# Patient Record
Sex: Male | Born: 1985
Health system: Southern US, Community
[De-identification: ages and names within clinical notes are randomized; demographics above are authoritative.]

## PROBLEM LIST (undated history)

## (undated) DIAGNOSIS — K922 Gastrointestinal hemorrhage, unspecified: Secondary | ICD-10-CM

## (undated) DIAGNOSIS — F319 Bipolar disorder, unspecified: Secondary | ICD-10-CM

## (undated) DIAGNOSIS — Z9889 Other specified postprocedural states: Secondary | ICD-10-CM

## (undated) DIAGNOSIS — IMO0002 Reserved for concepts with insufficient information to code with codable children: Secondary | ICD-10-CM

---

## 2012-11-24 ENCOUNTER — Encounter (HOSPITAL_COMMUNITY): Payer: Self-pay | Admitting: Emergency Medicine

## 2012-11-24 ENCOUNTER — Emergency Department (HOSPITAL_COMMUNITY): Payer: Managed Care, Other (non HMO)

## 2012-11-24 ENCOUNTER — Emergency Department (HOSPITAL_COMMUNITY)
Admission: EM | Admit: 2012-11-24 | Discharge: 2012-11-24 | Disposition: A | Discharge status: Home / Self Care | Payer: Managed Care, Other (non HMO) | Attending: Emergency Medicine | Admitting: Emergency Medicine

## 2012-11-24 DIAGNOSIS — R1110 Vomiting, unspecified: Secondary | ICD-10-CM

## 2012-11-24 DIAGNOSIS — R1084 Generalized abdominal pain: Secondary | ICD-10-CM | POA: Insufficient documentation

## 2012-11-24 DIAGNOSIS — R1032 Left lower quadrant pain: Secondary | ICD-10-CM | POA: Insufficient documentation

## 2012-11-24 DIAGNOSIS — F17200 Nicotine dependence, unspecified, uncomplicated: Secondary | ICD-10-CM | POA: Insufficient documentation

## 2012-11-24 DIAGNOSIS — R112 Nausea with vomiting, unspecified: Secondary | ICD-10-CM | POA: Insufficient documentation

## 2012-11-24 LAB — COMPREHENSIVE METABOLIC PANEL
BUN: 10 mg/dL (ref 6–23)
Calcium: 10.3 mg/dL (ref 8.4–10.5)
GFR calc Af Amer: >90 mL/min (ref >90)
Glucose, Bld: 156 mg/dL — ABNORMAL HIGH (ref 70–99)
Total Protein: 7.2 g/dL (ref 6.0–8.3)

## 2012-11-24 LAB — URINALYSIS, ROUTINE W REFLEX MICROSCOPIC
Bilirubin Urine: NEGATIVE
Hgb urine dipstick: NEGATIVE
Protein, ur: NEGATIVE mg/dL
Specific Gravity, Urine: 1.023 (ref 1.005–1.030)
Urobilinogen, UA: 0.2 mg/dL (ref 0.0–1.0)

## 2012-11-24 LAB — CBC WITH DIFFERENTIAL/PLATELET
Eosinophils Absolute: 0 10*3/uL (ref 0.0–0.7)
Eosinophils Relative: 0 % (ref 0–5)
Hemoglobin: 14.7 g/dL (ref 13.0–17.0)
Lymphs Abs: 1.1 10*3/uL (ref 0.7–4.0)
MCH: 30.8 pg (ref 26.0–34.0)
MCV: 88.7 fL (ref 78.0–100.0)
Monocytes Relative: 7 % (ref 3–12)
Platelets: 230 10*3/uL (ref 150–400)
RBC: 4.78 MIL/uL (ref 4.22–5.81)

## 2012-11-24 LAB — LIPASE, BLOOD: Lipase: 30 U/L (ref 11–59)

## 2012-11-24 LAB — TYPE AND SCREEN: Antibody Screen: NEGATIVE

## 2012-11-24 LAB — RAPID URINE DRUG SCREEN, HOSP PERFORMED
Barbiturates: NOT DETECTED
Cocaine: NOT DETECTED
Tetrahydrocannabinol: POSITIVE — AB

## 2012-11-24 MED ORDER — IOHEXOL 300 MG/ML  SOLN
25.0000 mL | INTRAMUSCULAR | Status: AC
  Administered 2012-11-24: 25 mL via ORAL

## 2012-11-24 MED ORDER — MORPHINE SULFATE 4 MG/ML IJ SOLN
4.0000 mg | Freq: Once | INTRAMUSCULAR | Status: AC
  Administered 2012-11-24: 4 mg via INTRAVENOUS

## 2012-11-24 MED ORDER — IOHEXOL 300 MG/ML  SOLN
80.0000 mL | Freq: Once | INTRAMUSCULAR | Status: AC | PRN
  Administered 2012-11-24: 80 mL via INTRAVENOUS

## 2012-11-24 MED ORDER — PROMETHAZINE HCL 25 MG PO TABS: 25.0000 mg | ORAL_TABLET | Freq: Four times a day (QID) | ORAL | Status: DC | PRN

## 2012-11-24 MED ORDER — PROMETHAZINE HCL 25 MG/ML IJ SOLN
25.0000 mg | Freq: Once | INTRAMUSCULAR | Status: AC
  Administered 2012-11-24: 25 mg via INTRAVENOUS
  Filled 2012-11-24: qty 1

## 2012-11-24 MED ORDER — ONDANSETRON HCL 4 MG/2ML IJ SOLN
4.0000 mg | Freq: Once | INTRAMUSCULAR | Status: AC
  Administered 2012-11-24: 4 mg via INTRAVENOUS

## 2012-11-24 MED ORDER — SODIUM CHLORIDE 0.9 % IV BOLUS (SEPSIS)
1000.0000 mL | Freq: Once | INTRAVENOUS | Status: AC
  Administered 2012-11-24: 1000 mL via INTRAVENOUS

## 2012-11-24 MED ORDER — PROMETHAZINE HCL 25 MG/ML IJ SOLN
25.0000 mg | INTRAMUSCULAR | Status: DC | PRN
Start: 2012-11-24 — End: 2012-11-24
  Administered 2012-11-24: 25 mg via INTRAVENOUS
  Filled 2012-11-24: qty 1

## 2012-11-24 NOTE — ED Provider Notes (Signed)
TIME SEEN: 3:01 AM  CHIEF COMPLAINT: Vomiting, abdominal pain  HPI: Patient is a 27 year old male with a history of prior binge drinking and prior upper GI bleed who presents the emergency department with diffuse abdominal pain worse in the left lower quadrant and vomiting for the past 2 hours. He reports his symptoms are similar to a with drinking heavily. He reports he had 2 drinks tonight. He is unsure if he has had pancreatitis before. He states that he did have an endoscopy approximately 1-2 years ago but is unsure of the results. Denies any fever. No chest pain or shortness of breath. No diarrhea. No melena. No bloody stool.  ROS: See HPI Constitutional: no fever  Eyes: no drainage  ENT: no runny nose   Cardiovascular:  no chest pain  Resp: no SOB  GI:  vomiting GU: no dysuria Integumentary: no rash  Allergy: no hives  Musculoskeletal: no leg swelling  Neurological: no slurred speech ROS otherwise negative  PAST MEDICAL HISTORY/PAST SURGICAL HISTORY:  History reviewed. No pertinent past medical history.  MEDICATIONS:  Prior to Admission medications   Not on File    ALLERGIES:  No Known Allergies  SOCIAL HISTORY:  History  Substance Use Topics  . Smoking status: Current Every Day Smoker -- 1.00 packs/day    Types: Cigarettes  . Smokeless tobacco: Not on file  . Alcohol Use: Yes    FAMILY HISTORY: History reviewed. No pertinent family history.  EXAM: BP 129/72  Temp(Src) 98.1 F (36.7 C) (Oral)  Resp 20  SpO2 100% CONSTITUTIONAL: Pale, appears uncomfortable, vomiting actively HEAD: Normocephalic EYES: Conjunctivae clear, PERRL, no pale conjuctivae ENT: normal nose; no rhinorrhea; moist mucous membranes; pharynx without lesions noted NECK: Supple, no meningismus, no LAD  CARD: RRR; S1 and S2 appreciated; no murmurs, no clicks, no rubs, no gallops RESP: Normal chest excursion without splinting or tachypnea; breath sounds clear and equal bilaterally; no  wheezes, no rhonchi, no rales,  ABD/GI: Normal bowel sounds; non-distended; soft, diffusely tender to palpation but worse in the left lower quadrant without guarding or rebound BACK:  The back appears normal and is non-tender to palpation, there is no CVA tenderness EXT: Normal ROM in all joints; non-tender to palpation; no edema; normal capillary refill; no cyanosis    SKIN: Normal color for age and race; warm NEURO: Moves all extremities equally PSYCH: The patient's mood and manner are appropriate. Grooming and personal hygiene are appropriate.  MEDICAL DECISION MAKING: Patient with prior upper GI bleed after heavy drinking who presents emergency department with diffuse abdominal pain and vomiting after 2 alcoholic drinks tonight. Patient denies a history of recent heavy alcohol use. He is not sure if he has had esophageal varices or pancreatitis in the past. He states his last endoscopy was approximately 2 years ago at [facility]. On exam, patient is vomiting but hemodynamically stable. His abdomen is diffusely tender to palpation he has no peritoneal signs. Will obtain basic labs, CT abdomen pelvis, and give pain meds and anti-emetics. Will obtain outside hospital records.  ED PROGRESS: Labs show leukocytosis with left shift. His LFTs and lipase are unremarkable. Outside hospital records from [facility] revealed a CT scan on 08/13/2011 which was normal. Have no other records for this patient at their facility. He reports now he thinks his endoscopy was at [facility]. Will attempt to obtain these medical records. Symptoms have improved after IV Phenergan. CT of his abdomen and pelvis is pending. Ethanol was 0.  5:00 AM  Patient reports he is vomiting blood. On my examination, patient is clearing his throat so violently that he spits up a very small amount of blood. There is not been any active vomiting in the last hour. He reports he is unable to keep his contrast down. Will discuss  with radiology to perform CT without contrast.  5:30 AM  Radiology does not feel they will get an adequate scan without oral contrast. Have discussed with patient that if he is unable to keep contrast down we will need to place NG tube.  Patient and family agree.  6:00 AM On review of records from [facility], patient was admitted to the hospital on 08/14/11 for coffee-ground emesis in the setting of heavy alcohol use. He did not have an endoscopy at that time. He was discharged home with a diagnosis of alcoholic gastritis versus ulcer.  6:32 AM  Pt reports feeling much better and has been able to tolerate by mouth liquids. No further vomiting. His CT scan is unremarkable. He is ready for discharge home. Patient feels that he must eat something bad. We'll discharge with return precautions, prescription for Phenergan, PCP followup. Patient and family verbalized understanding and are comfortable with the plan.     Layla Maw Vernell Back, DO 11/24/12 [id number]

## 2012-11-24 NOTE — ED Notes (Signed)
Per EMS- pt has been throwing up for 2 hours.

## 2012-11-24 NOTE — ED Notes (Signed)
Pt. States nausea is getting better but is unable to drink contrast. States "it upsets my stomach". Talked with Dr. Elesa Massed and CT staff who stated patient need to drink contrast. Talked with patient again about drinking contrast, patient cooperative.

## 2012-11-24 NOTE — ED Notes (Signed)
Pt states he has been throwing up for two hours. Pt states he did not eat today and had only two drinks tonight.

## 2012-11-26 ENCOUNTER — Emergency Department (HOSPITAL_COMMUNITY)
Admission: EM | Admit: 2012-11-26 | Discharge: 2012-11-26 | Disposition: A | Discharge status: Home / Self Care | Payer: Managed Care, Other (non HMO) | Attending: Emergency Medicine | Admitting: Emergency Medicine

## 2012-11-26 ENCOUNTER — Encounter (HOSPITAL_COMMUNITY): Payer: Self-pay

## 2012-11-26 DIAGNOSIS — R109 Unspecified abdominal pain: Secondary | ICD-10-CM

## 2012-11-26 DIAGNOSIS — R1084 Generalized abdominal pain: Secondary | ICD-10-CM | POA: Insufficient documentation

## 2012-11-26 DIAGNOSIS — E872 Acidosis, unspecified: Secondary | ICD-10-CM | POA: Insufficient documentation

## 2012-11-26 DIAGNOSIS — F17200 Nicotine dependence, unspecified, uncomplicated: Secondary | ICD-10-CM | POA: Insufficient documentation

## 2012-11-26 DIAGNOSIS — R197 Diarrhea, unspecified: Secondary | ICD-10-CM | POA: Insufficient documentation

## 2012-11-26 DIAGNOSIS — R52 Pain, unspecified: Secondary | ICD-10-CM | POA: Insufficient documentation

## 2012-11-26 DIAGNOSIS — F319 Bipolar disorder, unspecified: Secondary | ICD-10-CM | POA: Insufficient documentation

## 2012-11-26 DIAGNOSIS — Z872 Personal history of diseases of the skin and subcutaneous tissue: Secondary | ICD-10-CM | POA: Insufficient documentation

## 2012-11-26 DIAGNOSIS — E860 Dehydration: Secondary | ICD-10-CM | POA: Insufficient documentation

## 2012-11-26 DIAGNOSIS — Z8719 Personal history of other diseases of the digestive system: Secondary | ICD-10-CM | POA: Insufficient documentation

## 2012-11-26 DIAGNOSIS — Z79899 Other long term (current) drug therapy: Secondary | ICD-10-CM | POA: Insufficient documentation

## 2012-11-26 DIAGNOSIS — Z9889 Other specified postprocedural states: Secondary | ICD-10-CM | POA: Insufficient documentation

## 2012-11-26 DIAGNOSIS — R112 Nausea with vomiting, unspecified: Secondary | ICD-10-CM | POA: Insufficient documentation

## 2012-11-26 HISTORY — DX: Other specified postprocedural states: Z98.890

## 2012-11-26 HISTORY — DX: Gastrointestinal hemorrhage, unspecified: K92.2

## 2012-11-26 HISTORY — DX: Bipolar disorder, unspecified: F31.9

## 2012-11-26 HISTORY — DX: Reserved for concepts with insufficient information to code with codable children: IMO0002

## 2012-11-26 LAB — COMPREHENSIVE METABOLIC PANEL
Albumin: 4.8 g/dL (ref 3.5–5.2)
Alkaline Phosphatase: 62 U/L (ref 39–117)
BUN: 7 mg/dL (ref 6–23)
Calcium: 10 mg/dL (ref 8.4–10.5)
Potassium: 3 mEq/L — ABNORMAL LOW (ref 3.5–5.1)
Total Protein: 7.6 g/dL (ref 6.0–8.3)

## 2012-11-26 LAB — URINALYSIS, ROUTINE W REFLEX MICROSCOPIC
Bilirubin Urine: NEGATIVE
Glucose, UA: NEGATIVE mg/dL
Hgb urine dipstick: NEGATIVE
Specific Gravity, Urine: 1.022 (ref 1.005–1.030)
pH: 8.5 — ABNORMAL HIGH (ref 5.0–8.0)

## 2012-11-26 LAB — CBC WITH DIFFERENTIAL/PLATELET
HCT: 43.4 % (ref 39.0–52.0)
Hemoglobin: 15.5 g/dL (ref 13.0–17.0)
Lymphocytes Relative: 11 % — ABNORMAL LOW (ref 12–46)
Lymphs Abs: 1.2 10*3/uL (ref 0.7–4.0)
MCHC: 35.7 g/dL (ref 30.0–36.0)
Monocytes Absolute: 1.1 10*3/uL — ABNORMAL HIGH (ref 0.1–1.0)
Monocytes Relative: 10 % (ref 3–12)
Neutro Abs: 8.7 10*3/uL — ABNORMAL HIGH (ref 1.7–7.7)
RBC: 4.89 MIL/uL (ref 4.22–5.81)
WBC: 11.1 10*3/uL — ABNORMAL HIGH (ref 4.0–10.5)

## 2012-11-26 LAB — CG4 I-STAT (LACTIC ACID): Lactic Acid, Venous: 4.23 mmol/L — ABNORMAL HIGH (ref 0.5–2.2)

## 2012-11-26 LAB — LIPASE, BLOOD: Lipase: 23 U/L (ref 11–59)

## 2012-11-26 LAB — URINE MICROSCOPIC-ADD ON

## 2012-11-26 MED ORDER — ONDANSETRON HCL 4 MG/2ML IJ SOLN
4.0000 mg | Freq: Once | INTRAMUSCULAR | Status: AC
  Administered 2012-11-26: 4 mg via INTRAVENOUS
  Filled 2012-11-26: qty 2

## 2012-11-26 MED ORDER — SODIUM CHLORIDE 0.9 % IV BOLUS (SEPSIS)
2000.0000 mL | Freq: Once | INTRAVENOUS | Status: AC
  Administered 2012-11-26: 1000 mL via INTRAVENOUS

## 2012-11-26 MED ORDER — POTASSIUM CHLORIDE 10 MEQ/100ML IV SOLN
10.0000 meq | INTRAVENOUS | Status: AC
  Administered 2012-11-26 (×4): 10 meq via INTRAVENOUS
  Filled 2012-11-26: qty 100

## 2012-11-26 MED ORDER — FAMOTIDINE IN NACL 20-0.9 MG/50ML-% IV SOLN
20.0000 mg | Freq: Once | INTRAVENOUS | Status: AC
  Administered 2012-11-26: 20 mg via INTRAVENOUS
  Filled 2012-11-26: qty 50

## 2012-11-26 MED ORDER — HYDROMORPHONE HCL PF 1 MG/ML IJ SOLN
1.0000 mg | Freq: Once | INTRAMUSCULAR | Status: AC
  Administered 2012-11-26: 1 mg via INTRAVENOUS
  Filled 2012-11-26: qty 1

## 2012-11-26 MED ORDER — ONDANSETRON 4 MG PO TBDP: 4.0000 mg | ORAL_TABLET | Freq: Three times a day (TID) | ORAL | Status: DC | PRN

## 2012-11-26 NOTE — ED Provider Notes (Signed)
CSN: [id number]     Arrival date & time 11/26/12  0145 History   First MD Initiated Contact with Patient 11/26/12 0150     Chief Complaint  Patient presents with  . Abdominal Pain   (Consider location/radiation/quality/duration/timing/severity/associated sxs/prior Treatment) HPI This patient is a 27 year old man who presents with complaints of diffuse abdominal pain, nausea and vomiting along with diarrhea for the past 3 days. The patient reports intractable vomiting. He is unable to tolerate any by mouth intake. He says he has had innumerable episodes of nonbloody, nonbilious emesis per day. He has had about 5-6 episodes per day of brown, watery and nonbloody diarrhea stools.  The patient has not appreciated a fever. He describes his pain as diffuse, cramping and 10 over 10 in severity.  The patient was seen in this emergency department yesterday and had a very extensive workup which included a contrasted CT scan of the abdomen and pelvis which was unremarkable. The patient was noted to have a white blood cell count of 14,000 and a glucose of 156. He was also noted to have significant ketones in his urine. His kidney and liver function were normal. Lipase level was normal.  The patient was discharged with a prescription for promethazine. He says he has been unable to tolerate this medication.  The patient says he drinks alcohol 3-4 times per week and usually drinks 2-3 drinks at a time. He does not believe that he drinks to excess. He does not use NSAIDs on a regular basis. He says that he thinks he may have been told that he has had a peptic ulcer in the past. However, he has not had symptoms such as these before.  Past Medical History  Diagnosis Date  . Bipolar 1 disorder   . GI bleed   . H/O endoscopy   . Ulcer    History reviewed. No pertinent past surgical history. History reviewed. No pertinent family history. History  Substance Use Topics  . Smoking status: Current Every Day  Smoker -- 1.00 packs/day    Types: Cigarettes  . Smokeless tobacco: Not on file  . Alcohol Use: Yes    Review of Systems Gen: General malaise. No fever. No chills. No night sweats. Eyes: no discharge or drainage, no occular pain or visual changes Nose: no epistaxis or rhinorrhea Mouth: no dental pain, no sore throat Neck: no neck pain Lungs: no SOB, cough, wheezing CV: no chest pain, palpitations, dependent edema or orthopnea Abd: As per history of present illness, otherwise negative GU: Patient notes that his urine has appeared more concentrated than usual. MSK: no myalgias or arthralgias Neuro: no headache, no focal neurologic deficits Skin: no rash Psyche: Bipolar disorder which the patient reports is well controlled.  Allergies  Review of patient's allergies indicates no known allergies.  Home Medications   Current Outpatient Rx  Name  Route  Sig  Dispense  Refill  . HydrOXYzine Pamoate (VISTARIL PO)   Oral   Take 1 tablet by mouth 4 (four) times daily as needed.         . lamoTRIgine (LAMICTAL) 100 MG tablet   Oral   Take 100 mg by mouth daily.         . ondansetron (ZOFRAN) 4 MG/2ML SOLN injection   Intravenous   Inject 4 mg into the vein once. X 2 doses         . promethazine (PHENERGAN) 25 MG tablet   Oral   Take 1 tablet (25 mg total)  by mouth every 6 (six) hours as needed for nausea.   15 tablet   0    BP 136/70  Temp(Src) 98.2 F (36.8 C) (Oral)  Resp 18  SpO2 100% Physical Exam Gen: well developed and well nourished appearing, the patient appears uncomfortable, he is, saying, laying supine in gurney with a bath towel held against his chest. Head: NCAT Eyes: PERL, EOMI Nose: no epistaixis or rhinorrhea Mouth/throat: mucosa is moist and pink Neck: supple, no stridor Lungs: CTA B, no wheezing, rhonchi or rales CV: Regular rate and rhythm, no murmur, rate approximately 60 beats per minute Abd: The abdomen is soft and nondistended but  diffusely tender. Back: no ttp, no cva ttp Skin: no rashese, wnl Neuro: CN ii-xii grossly intact, no focal deficits Psyche; normal affect,  calm and cooperative.   ED Course  Procedures (including critical care time)  Results for orders placed during the hospital encounter of 11/26/12 (from the past 24 hour(s))  CG4 I-STAT (LACTIC ACID)     Status: Abnormal   Collection Time    11/26/12  2:08 AM      Result Value Range   Lactic Acid, Venous 4.23 (*) 0.5 - 2.2 mmol/L  COMPREHENSIVE METABOLIC PANEL     Status: Abnormal   Collection Time    11/26/12  2:12 AM      Result Value Range   Sodium 139  135 - 145 mEq/L   Potassium 3.0 (*) 3.5 - 5.1 mEq/L   Chloride 98  96 - 112 mEq/L   CO2 20  19 - 32 mEq/L   Glucose, Bld 164 (*) 70 - 99 mg/dL   BUN 7  6 - 23 mg/dL   Creatinine, Ser 5.62  0.50 - 1.35 mg/dL   Calcium 13.0  8.4 - 86.5 mg/dL   Total Protein 7.6  6.0 - 8.3 g/dL   Albumin 4.8  3.5 - 5.2 g/dL   AST 20  0 - 37 U/L   ALT 17  0 - 53 U/L   Alkaline Phosphatase 62  39 - 117 U/L   Total Bilirubin 0.8  0.3 - 1.2 mg/dL   GFR calc non Af Amer >90  >90 mL/min   GFR calc Af Amer >90  >90 mL/min  LIPASE, BLOOD     Status: None   Collection Time    11/26/12  2:12 AM      Result Value Range   Lipase 23  11 - 59 U/L  CBC WITH DIFFERENTIAL     Status: Abnormal   Collection Time    11/26/12  2:12 AM      Result Value Range   WBC 11.1 (*) 4.0 - 10.5 K/uL   RBC 4.89  4.22 - 5.81 MIL/uL   Hemoglobin 15.5  13.0 - 17.0 g/dL   HCT 78.4  69.6 - 29.5 %   MCV 88.8  78.0 - 100.0 fL   MCH 31.7  26.0 - 34.0 pg   MCHC 35.7  30.0 - 36.0 g/dL   RDW 28.4  13.2 - 44.0 %   Platelets 262  150 - 400 K/uL   Neutrophils Relative % 78 (*) 43 - 77 %   Neutro Abs 8.7 (*) 1.7 - 7.7 K/uL   Lymphocytes Relative 11 (*) 12 - 46 %   Lymphs Abs 1.2  0.7 - 4.0 K/uL   Monocytes Relative 10  3 - 12 %   Monocytes Absolute 1.1 (*) 0.1 - 1.0 K/uL   Eosinophils Relative  0  0 - 5 %   Eosinophils Absolute 0.0  0.0  - 0.7 K/uL   Basophils Relative 0  0 - 1 %   Basophils Absolute 0.0  0.0 - 0.1 K/uL  LACTIC ACID, PLASMA     Status: None   Collection Time    11/26/12  3:20 AM      Result Value Range   Lactic Acid, Venous 1.6  0.5 - 2.2 mmol/L    Labs Review .Imaging Review Ct Abdomen Pelvis W Contrast  11/24/2012   *RADIOLOGY REPORT*  Clinical Data: Left lower quadrant pain.  CT ABDOMEN AND PELVIS WITH CONTRAST  Technique:  Multidetector CT imaging of the abdomen and pelvis was performed following the standard protocol during bolus administration of intravenous contrast.  Contrast: 80mL OMNIPAQUE IOHEXOL 300 MG/ML  SOLN  Comparison: None.  Findings:  LOWER CHEST:  Mediastinum: Small sliding-type hiatal hernia.  Lungs/pleura: No consolidation.  ABDOMEN/PELVIS:  Liver: No focal abnormality.  Biliary: No evidence of biliary obstruction or stone.  Pancreas: Unremarkable.  Spleen: Unremarkable.  Adrenals: Unremarkable.  Kidneys and ureters: No hydronephrosis or stone.  Bladder: Unremarkable.  Bowel: No obstruction. Normal appendix.  Retroperitoneum: No mass or adenopathy.  Peritoneum: No free fluid or gas.  Reproductive: Unremarkable.  Vascular: No acute abnormality.  OSSEOUS: No acute abnormalities. Calcifications associated with the root sleeve in the right anterior S1 sacral foramen.  IMPRESSION: No acute intra-abdominal abnormality.   Original Report Authenticated By: Tiburcio Pea    MDM  Patient feeling much better after tx with 2L of crystalloid IV, Zofran, analgesia and antiemetic. Looks completely better. Now laughing and conversing. Abdominal pain has resolved. Initial lactic acid done with POC device was elevated but, recheck with traditional assay is wnl. We will po challenge and re-evaluate for discharge. The patient has a history of PUD and is currently taking omeprazole. I have suggested that he follow up with his gastroenterologist.   We will prescribe Zofran as an alternative to promethazine which  has not been effective in controlling the patient's sx. He is counseled to return to the ED for worsening sx or red flag sx.     Brandt Loosen, MD 11/27/12 2255

## 2012-11-26 NOTE — ED Notes (Signed)
Pt handed an urinal to use 

## 2012-11-26 NOTE — ED Notes (Signed)
Pt c/o generalized abd pain, nausea, and vomiting blood x2 days. Pt seen here last night for the same and discharged home at 0130 am

## 2012-11-26 NOTE — ED Notes (Signed)
Pt asked could he obtain a urine sample. Pt stated that he couldn't obtain one at this moment. Pt advised to call staff once he feels he can get a urine sample. Will follow up shortly

## 2012-12-23 ENCOUNTER — Inpatient Hospital Stay (HOSPITAL_COMMUNITY)
Admission: EM | Admit: 2012-12-23 | Discharge: 2012-12-26 | DRG: 918 | Disposition: A | Discharge status: Home / Self Care | Payer: Managed Care, Other (non HMO) | Attending: Internal Medicine | Admitting: Internal Medicine

## 2012-12-23 ENCOUNTER — Encounter (HOSPITAL_COMMUNITY): Payer: Self-pay | Admitting: *Deleted

## 2012-12-23 DIAGNOSIS — F319 Bipolar disorder, unspecified: Secondary | ICD-10-CM | POA: Diagnosis present

## 2012-12-23 DIAGNOSIS — F1210 Cannabis abuse, uncomplicated: Secondary | ICD-10-CM | POA: Diagnosis present

## 2012-12-23 DIAGNOSIS — F12129 Cannabis abuse with intoxication, unspecified: Secondary | ICD-10-CM

## 2012-12-23 DIAGNOSIS — R1115 Cyclical vomiting syndrome unrelated to migraine: Secondary | ICD-10-CM | POA: Diagnosis present

## 2012-12-23 DIAGNOSIS — K920 Hematemesis: Secondary | ICD-10-CM | POA: Diagnosis present

## 2012-12-23 DIAGNOSIS — T407X1A Poisoning by cannabis (derivatives), accidental (unintentional), initial encounter: Secondary | ICD-10-CM

## 2012-12-23 DIAGNOSIS — F1221 Cannabis dependence, in remission: Secondary | ICD-10-CM | POA: Diagnosis present

## 2012-12-23 DIAGNOSIS — R109 Unspecified abdominal pain: Secondary | ICD-10-CM

## 2012-12-23 DIAGNOSIS — T40904A Poisoning by unspecified psychodysleptics [hallucinogens], undetermined, initial encounter: Principal | ICD-10-CM | POA: Diagnosis present

## 2012-12-23 DIAGNOSIS — R112 Nausea with vomiting, unspecified: Secondary | ICD-10-CM

## 2012-12-23 DIAGNOSIS — E872 Acidosis, unspecified: Secondary | ICD-10-CM | POA: Diagnosis present

## 2012-12-23 DIAGNOSIS — F17200 Nicotine dependence, unspecified, uncomplicated: Secondary | ICD-10-CM | POA: Diagnosis present

## 2012-12-23 DIAGNOSIS — E876 Hypokalemia: Secondary | ICD-10-CM | POA: Diagnosis present

## 2012-12-23 DIAGNOSIS — T40901A Poisoning by unspecified psychodysleptics [hallucinogens], accidental (unintentional), initial encounter: Secondary | ICD-10-CM | POA: Diagnosis present

## 2012-12-23 LAB — CBC WITH DIFFERENTIAL/PLATELET
Basophils Absolute: 0.1 10*3/uL (ref 0.0–0.1)
Basophils Relative: 0 % (ref 0–1)
Eosinophils Absolute: 0.1 10*3/uL (ref 0.0–0.7)
Eosinophils Relative: 0 % (ref 0–5)
MCH: 32 pg (ref 26.0–34.0)
MCHC: 36.2 g/dL — ABNORMAL HIGH (ref 30.0–36.0)
MCV: 88.4 fL (ref 78.0–100.0)
Neutrophils Relative %: 84 % — ABNORMAL HIGH (ref 43–77)
Platelets: 229 10*3/uL (ref 150–400)
RBC: 4.93 MIL/uL (ref 4.22–5.81)
RDW: 13 % (ref 11.5–15.5)

## 2012-12-23 MED ORDER — SODIUM CHLORIDE 0.9 % IV BOLUS (SEPSIS)
1000.0000 mL | Freq: Once | INTRAVENOUS | Status: AC
  Administered 2012-12-24: 1000 mL via INTRAVENOUS

## 2012-12-23 MED ORDER — SODIUM CHLORIDE 0.9 % IV SOLN
80.0000 mg | Freq: Once | INTRAVENOUS | Status: AC
  Administered 2012-12-24: 01:00:00 80 mg via INTRAVENOUS
  Filled 2012-12-23: qty 80

## 2012-12-23 MED ORDER — MORPHINE SULFATE 4 MG/ML IJ SOLN
4.0000 mg | Freq: Once | INTRAMUSCULAR | Status: AC
  Administered 2012-12-24: 4 mg via INTRAVENOUS
  Filled 2012-12-23: qty 1

## 2012-12-23 MED ORDER — PROMETHAZINE HCL 25 MG/ML IJ SOLN
25.0000 mg | Freq: Once | INTRAMUSCULAR | Status: AC
  Administered 2012-12-24: 25 mg via INTRAVENOUS
  Filled 2012-12-23: qty 1

## 2012-12-23 MED ORDER — ONDANSETRON 4 MG PO TBDP
8.0000 mg | ORAL_TABLET | Freq: Once | ORAL | Status: AC
  Administered 2012-12-23: 8 mg via ORAL
  Filled 2012-12-23: qty 2

## 2012-12-23 NOTE — ED Notes (Signed)
The pt is c/o abd pain  For one hour with nv.  History of the same

## 2012-12-23 NOTE — ED Notes (Signed)
The pt had 1-2 beers  earlier

## 2012-12-24 ENCOUNTER — Emergency Department (HOSPITAL_COMMUNITY): Payer: Managed Care, Other (non HMO)

## 2012-12-24 DIAGNOSIS — R112 Nausea with vomiting, unspecified: Secondary | ICD-10-CM

## 2012-12-24 DIAGNOSIS — T43591A Poisoning by other antipsychotics and neuroleptics, accidental (unintentional), initial encounter: Secondary | ICD-10-CM

## 2012-12-24 DIAGNOSIS — R1115 Cyclical vomiting syndrome unrelated to migraine: Secondary | ICD-10-CM | POA: Diagnosis present

## 2012-12-24 DIAGNOSIS — F19929 Other psychoactive substance use, unspecified with intoxication, unspecified: Secondary | ICD-10-CM

## 2012-12-24 DIAGNOSIS — T40904A Poisoning by unspecified psychodysleptics [hallucinogens], undetermined, initial encounter: Principal | ICD-10-CM

## 2012-12-24 DIAGNOSIS — T407X1A Poisoning by cannabis (derivatives), accidental (unintentional), initial encounter: Secondary | ICD-10-CM

## 2012-12-24 DIAGNOSIS — K920 Hematemesis: Secondary | ICD-10-CM

## 2012-12-24 DIAGNOSIS — F1221 Cannabis dependence, in remission: Secondary | ICD-10-CM | POA: Diagnosis present

## 2012-12-24 LAB — URINE MICROSCOPIC-ADD ON

## 2012-12-24 LAB — CBC
Hemoglobin: 13.3 g/dL (ref 13.0–17.0)
MCH: 31.1 pg (ref 26.0–34.0)
MCHC: 35 g/dL (ref 30.0–36.0)
MCV: 88.8 fL (ref 78.0–100.0)
Platelets: 181 10*3/uL (ref 150–400)
RDW: 13.3 % (ref 11.5–15.5)

## 2012-12-24 LAB — URINALYSIS, ROUTINE W REFLEX MICROSCOPIC
Bilirubin Urine: NEGATIVE
Ketones, ur: >80 mg/dL — AB
Nitrite: NEGATIVE
Protein, ur: NEGATIVE mg/dL
Specific Gravity, Urine: 1.026 (ref 1.005–1.030)
Urobilinogen, UA: 0.2 mg/dL (ref 0.0–1.0)

## 2012-12-24 LAB — GASTRIC OCCULT BLOOD (1-CARD TO LAB): Occult Blood, Gastric: POSITIVE — AB

## 2012-12-24 LAB — RAPID URINE DRUG SCREEN, HOSP PERFORMED
Barbiturates: NOT DETECTED
Benzodiazepines: POSITIVE — AB
Cocaine: NOT DETECTED
Opiates: POSITIVE — AB
Tetrahydrocannabinol: POSITIVE — AB

## 2012-12-24 LAB — COMPREHENSIVE METABOLIC PANEL
ALT: 16 U/L (ref 0–53)
Albumin: 4.7 g/dL (ref 3.5–5.2)
Alkaline Phosphatase: 69 U/L (ref 39–117)
Calcium: 9.8 mg/dL (ref 8.4–10.5)
GFR calc Af Amer: >90 mL/min (ref >90)
Potassium: 3.8 mEq/L (ref 3.5–5.1)
Sodium: 139 mEq/L (ref 135–145)
Total Protein: 7.6 g/dL (ref 6.0–8.3)

## 2012-12-24 LAB — CG4 I-STAT (LACTIC ACID): Lactic Acid, Venous: 5.38 mmol/L — ABNORMAL HIGH (ref 0.5–2.2)

## 2012-12-24 LAB — LIPASE, BLOOD: Lipase: 23 U/L (ref 11–59)

## 2012-12-24 LAB — TYPE AND SCREEN

## 2012-12-24 LAB — LACTIC ACID, PLASMA: Lactic Acid, Venous: 1.7 mmol/L (ref 0.5–2.2)

## 2012-12-24 MED ORDER — ONDANSETRON HCL 4 MG/2ML IJ SOLN
4.0000 mg | Freq: Once | INTRAMUSCULAR | Status: AC
  Administered 2012-12-24: 4 mg via INTRAVENOUS
  Filled 2012-12-24: qty 2

## 2012-12-24 MED ORDER — INFLUENZA VAC SPLIT QUAD 0.5 ML IM SUSP
0.5000 mL | INTRAMUSCULAR | Status: AC
  Administered 2012-12-24: 0.5 mL via INTRAMUSCULAR
  Filled 2012-12-24: qty 0.5

## 2012-12-24 MED ORDER — LAMOTRIGINE 100 MG PO TABS
100.0000 mg | ORAL_TABLET | Freq: Every day | ORAL | Status: DC
  Administered 2012-12-24 – 2012-12-26 (×2): 100 mg via ORAL
  Filled 2012-12-24 (×3): qty 1

## 2012-12-24 MED ORDER — INFLUENZA VAC SPLIT QUAD 0.5 ML IM SUSP: 0.5000 mL | INTRAMUSCULAR | Status: DC

## 2012-12-24 MED ORDER — ONDANSETRON 4 MG PO TBDP
4.0000 mg | ORAL_TABLET | Freq: Three times a day (TID) | ORAL | Status: DC | PRN
  Administered 2012-12-26: 4 mg via ORAL
  Filled 2012-12-24 (×3): qty 1

## 2012-12-24 MED ORDER — SODIUM CHLORIDE 0.9 % IV BOLUS (SEPSIS)
1000.0000 mL | Freq: Once | INTRAVENOUS | Status: AC
  Administered 2012-12-24: 1000 mL via INTRAVENOUS

## 2012-12-24 MED ORDER — ONDANSETRON HCL 4 MG/2ML IJ SOLN
4.0000 mg | Freq: Three times a day (TID) | INTRAMUSCULAR | Status: AC | PRN
  Administered 2012-12-24: 4 mg via INTRAVENOUS
  Filled 2012-12-24: qty 2

## 2012-12-24 MED ORDER — CHLORHEXIDINE GLUCONATE 0.12 % MT SOLN
15.0000 mL | Freq: Two times a day (BID) | OROMUCOSAL | Status: DC
  Administered 2012-12-24: 15 mL via OROMUCOSAL
  Filled 2012-12-24: qty 15

## 2012-12-24 MED ORDER — PROMETHAZINE HCL 25 MG PO TABS
25.0000 mg | ORAL_TABLET | Freq: Four times a day (QID) | ORAL | Status: DC | PRN
  Administered 2012-12-24 – 2012-12-25 (×2): 25 mg via ORAL
  Filled 2012-12-24 (×2): qty 1

## 2012-12-24 MED ORDER — SODIUM CHLORIDE 0.9 % IV SOLN
INTRAVENOUS | Status: AC
  Administered 2012-12-24 (×2): via INTRAVENOUS

## 2012-12-24 MED ORDER — OXYCODONE HCL 5 MG PO TABS
5.0000 mg | ORAL_TABLET | Freq: Four times a day (QID) | ORAL | Status: DC | PRN
  Administered 2012-12-25: 5 mg via ORAL
  Filled 2012-12-24: qty 1

## 2012-12-24 NOTE — H&P (Addendum)
[facility] History and Physical  Preston Brewer ZOX:[id number] DOB: 13-Mar-1986 DOA: 12/23/2012  Referring physician: ED PCP: No PCP Per Patient  Chief Complaint: N/V  HPI: Preston Brewer is a 27 y.o. male who presents with acute onset, severe, nausea and vomiting.  Just as symptoms occurred last month during his ED stay, his symptoms were initially severe, associated with a lactic acidosis, but spontaneously resolved after a couple of hours while in the ED.  His vomit is blood streaked due to the severity of his vomiting.  The patient before I can ask about what makes symptoms better or worse, the patient volunteers that hot showers are the only thing that seems to make his symptoms better.  After he states this, I have his father leave the room for privacy concerns, and then ask the patient how much Cannabis he has been smoking.  He freely admits to smoking cannabis and when asked how much states "probably enough to kill a person".  When asked to clarify he states this is about 8 blunts a day.  Review of Systems: 12 systems reviewed and otherwise negative.  Past Medical History  Diagnosis Date  . Bipolar 1 disorder   . GI bleed   . H/O endoscopy   . Ulcer    History reviewed. No pertinent past surgical history. Social History:  reports that he has been smoking Cigarettes.  He has been smoking about 1.00 pack per day. He does not have any smokeless tobacco history on file. He reports that  drinks alcohol. He reports that he uses illicit drugs (Marijuana).   No Known Allergies  No family history on file.  Prior to Admission medications   Medication Sig Start Date End Date Taking? Authorizing Provider  calcium carbonate (TUMS EX) 750 MG chewable tablet Chew 4 tablets by mouth as needed for heartburn.   Yes Historical Provider, MD  HydrOXYzine Pamoate (VISTARIL PO) Take 1 tablet by mouth daily.    Yes Historical Provider, MD  lamoTRIgine (LAMICTAL) 100 MG tablet Take 100 mg by mouth  daily.   Yes Historical Provider, MD  ondansetron (ZOFRAN ODT) 4 MG disintegrating tablet Take 1 tablet (4 mg total) by mouth every 8 (eight) hours as needed for nausea. 11/26/12  Yes Jennifer L Piepenbrink, PA-C  promethazine (PHENERGAN) 25 MG tablet Take 1 tablet (25 mg total) by mouth every 6 (six) hours as needed for nausea. 11/24/12  Yes Kristen N Ward, DO   Physical Exam: Filed Vitals:   12/24/12 0431  BP: 127/70  Pulse: 63  Temp: 98.3 F (36.8 C)  Resp: 18    General:  NAD, resting comfortably in bed Eyes: PEERLA EOMI ENT: mucous membranes moist Neck: supple w/o JVD Cardiovascular: RRR w/o MRG Respiratory: CTA B Abdomen: soft, nt, nd, bs+ Skin: no rash nor lesion Musculoskeletal: MAE, full ROM all 4 extremities Psychiatric: normal tone and affect Neurologic: AAOx3, grossly non-focal  Labs on Admission:  Basic Metabolic Panel:  Recent Labs Lab 12/23/12 2310  NA 139  K 3.8  CL 98  CO2 20  GLUCOSE 195*  BUN 11  CREATININE 0.95  CALCIUM 9.8   Liver Function Tests:  Recent Labs Lab 12/23/12 2310  AST 21  ALT 16  ALKPHOS 69  BILITOT 0.7  PROT 7.6  ALBUMIN 4.7    Recent Labs Lab 12/23/12 2310  LIPASE 23   No results found for this basename: AMMONIA,  in the last 168 hours CBC:  Recent Labs Lab 12/23/12 2310  WBC  18.7*  NEUTROABS 15.8*  HGB 15.8  HCT 43.6  MCV 88.4  PLT 229   Cardiac Enzymes: No results found for this basename: CKTOTAL, CKMB, CKMBINDEX, TROPONINI,  in the last 168 hours  BNP (last 3 results) No results found for this basename: PROBNP,  in the last 8760 hours CBG: No results found for this basename: GLUCAP,  in the last 168 hours  Radiological Exams on Admission: Dg Abd Acute W/chest  12/24/2012   CLINICAL DATA:  Extreme abdominal pain. Vomiting blood.  EXAM: ACUTE ABDOMEN SERIES (ABDOMEN 2 VIEW & CHEST 1 VIEW)  COMPARISON:  Abdominal CT 11/24/2012  FINDINGS: There is no evidence of dilated bowel loops or free  intraperitoneal air. No radiopaque calculi or other significant radiographic abnormality is seen. Heart size and mediastinal contours are within normal limits. Both lungs are clear.  IMPRESSION: Negative abdominal radiographs.  No acute cardiopulmonary disease.   Electronically Signed   By: Tiburcio Pea M.D.   On: 12/24/2012 02:35    EKG: Independently reviewed.  Assessment/Plan Principal Problem:   Cyclic vomiting syndrome Active Problems:   Cannabis abuse with intoxication with complication   Cannabis overdose   1. Cannabis associated cyclic vomiting syndrome - Long discussion with patient, after thinking back on it patient states he now can see clear association between symptoms and cannabis use, he also freely admits that the amount of cannabis used is a large quantity.  This is especially true given the long term use.  Advised him to quit cannabis if possible.  Unfortunately his vomiting episodes while short and lasting only a few hours, seem to be severe enough to give him a mallory-weis tear.  The alleviating factor of hot showers are also very classic for [facility].    Code Status: Full Code (must indicate code status--if unknown or must be presumed, indicate so) Family Communication: Patient (who is an adult) will make decision to inform his father of our discussion, did not have father in room for talk in respect for patient privacy (indicate person spoken with, if applicable, with phone number if by telephone) Disposition Plan: Admit to obs (indicate anticipated LOS)  Time spent: 70 min  GARDNER, JARED M. [facility] Pager [phone]  If 7PM-7AM, please contact night-coverage [email or web address] Password [email or web address] 12/24/2012, 5:32 AM

## 2012-12-24 NOTE — ED Notes (Signed)
Pt unable to void at this time. 

## 2012-12-24 NOTE — Progress Notes (Signed)
Pt was feeling good this AM before breakfast.  Stated no nausea or vomiting since earlier in the ED when they medicated him.  He had No complaints of pain at that time either.  Pt tried to work on his clear liquid tray and seemed to be doing ok.  But then he developed pain/nausea and had a large episode of pinkish/red emesis (looked like red [place] ice/jello).  Zofran given as ordered, will continue to monitor.

## 2012-12-24 NOTE — Progress Notes (Signed)
[facility] PROGRESS NOTE  Preston Brewer ZOX:[id number] DOB: 1985/12/25 DOA: 12/23/2012 PCP: No PCP Per Patient I have seen and examined pt who is a 27 yo admitted this am by Dr Julian Reil with history of marijuana abuse and cyclical vomiting syndrome presenting with nausea vomiting. He states he tried to eat this a.m. but vomited-unable to tolerate the clear liquids, also complaining of some abdominal pain/soreness. Will continue current management plan as per Dr. Julian Reil, we'll slowly advance diet as tolerated and follow, patient counseled to quit marijuana and he agrees to "work on it[employer]  [facility] Pager [phone]. If 7PM-7AM, please contact night-coverage at [email or web address], password [facility] 12/24/2012, 10:51 AM  LOS: 1 day

## 2012-12-24 NOTE — ED Provider Notes (Signed)
CSN: [id number]     Arrival date & time 12/23/12  2317 History   First MD Initiated Contact with Patient 12/23/12 2338     Chief Complaint  Patient presents with  . Abdominal Pain   (Consider location/radiation/quality/duration/timing/severity/associated sxs/prior Treatment) HPI Patient is a 27 year old male with a history of bipolar disorder. He's been worked up in the past for similar episodes of abdominal pain. He comes in with acute onset diffuse abdominal pain that started at roughly 9 PM. Associated with multiple episodes of retching and vomiting. He's had red blood in his vomit. Patient states he had part of the beer earlier. States he missed his outpatient gastrointestinal followup appointment. He denies fevers or chills. He denies chest pain or shortness of breath. Past Medical History  Diagnosis Date  . Bipolar 1 disorder   . GI bleed   . H/O endoscopy   . Ulcer    History reviewed. No pertinent past surgical history. No family history on file. History  Substance Use Topics  . Smoking status: Current Every Day Smoker -- 1.00 packs/day    Types: Cigarettes  . Smokeless tobacco: Not on file  . Alcohol Use: Yes    Review of Systems  Constitutional: Positive for diaphoresis. Negative for fever and chills.  Respiratory: Negative for cough and shortness of breath.   Cardiovascular: Negative for chest pain, palpitations and leg swelling.  Gastrointestinal: Positive for nausea, vomiting and abdominal pain.  Musculoskeletal: Negative for myalgias and back pain.  Skin: Negative for rash and wound.  Neurological: Negative for dizziness, weakness, numbness and headaches.  All other systems reviewed and are negative.    Allergies  Review of patient's allergies indicates no known allergies.  Home Medications   Current Outpatient Rx  Name  Route  Sig  Dispense  Refill  . Ascorbic Acid (HALLS DEFENSE VITAMIN C DROPS MT)   Mouth/Throat   Use as directed 1 lozenge in the mouth  or throat as needed (sore throat).         . calcium carbonate (TUMS EX) 750 MG chewable tablet   Oral   Chew 4 tablets by mouth as needed for heartburn.         . HydrOXYzine Pamoate (VISTARIL PO)   Oral   Take 1 tablet by mouth 4 (four) times daily as needed (for bipolar symptoms).          Marland Kitchen lamoTRIgine (LAMICTAL) 100 MG tablet   Oral   Take 100 mg by mouth daily.         Marland Kitchen omeprazole (PRILOSEC OTC) 20 MG tablet   Oral   Take 20 mg by mouth 3 (three) times daily as needed (heartburn).         . ondansetron (ZOFRAN ODT) 4 MG disintegrating tablet   Oral   Take 1 tablet (4 mg total) by mouth every 8 (eight) hours as needed for nausea.   12 tablet   0   . promethazine (PHENERGAN) 25 MG tablet   Oral   Take 1 tablet (25 mg total) by mouth every 6 (six) hours as needed for nausea.   15 tablet   0    BP 150/73  Pulse 85  Temp(Src) 97.9 F (36.6 C) (Oral)  Resp 20  SpO2 100% Physical Exam  Nursing note and vitals reviewed. Constitutional: He is oriented to person, place, and time. He appears well-developed and well-nourished. He appears distressed.  Actively retching  HENT:  Head: Normocephalic and atraumatic.  Mouth/Throat: Oropharynx  is clear and moist.  Eyes: EOM are normal. Pupils are equal, round, and reactive to light.  Neck: Normal range of motion. Neck supple.  Cardiovascular: Normal rate and regular rhythm.   Pulmonary/Chest: Effort normal and breath sounds normal. No respiratory distress. He has no wheezes. He has no rales.  Abdominal: Soft. Bowel sounds are normal. He exhibits no distension and no mass. There is tenderness (diffuse tenderness to palpation worse in the epigastric region). There is no rebound and no guarding.  Musculoskeletal: Normal range of motion. He exhibits no edema and no tenderness.  Neurological: He is alert and oriented to person, place, and time.  Moves all extremities without deficit. sensation grossly intact  Skin: Skin  is warm. No rash noted. He is diaphoretic. No erythema.  Psychiatric:  Very anxious appearing    ED Course  Procedures (including critical care time) Labs Review Labs Reviewed  CBC WITH DIFFERENTIAL - Abnormal; Notable for the following:    WBC 18.7 (*)    MCHC 36.2 (*)    Neutrophils Relative % 84 (*)    Neutro Abs 15.8 (*)    Lymphocytes Relative 9 (*)    Monocytes Absolute 1.1 (*)    All other components within normal limits  CG4 I-STAT (LACTIC ACID) - Abnormal; Notable for the following:    Lactic Acid, Venous 5.38 (*)    All other components within normal limits  URINALYSIS, ROUTINE W REFLEX MICROSCOPIC  COMPREHENSIVE METABOLIC PANEL  LIPASE, BLOOD  ETHANOL  URINE RAPID DRUG SCREEN (HOSP PERFORMED)  POCT GASTRIC OCCULT BLOOD (1-CARD TO LAB)  TYPE AND SCREEN   Imaging Review No results found.  MDM    Patient's symptoms have resolved completely with meds and IV fluids. His abdomen is soft nontender. Saw signs remained stable. He's had no further hematemesis in the department. Discussed with [facility] and will admit for observation.   Loren Racer, MD 12/24/12 [id number]

## 2012-12-24 NOTE — ED Notes (Signed)
CG4-i-stat (Lactic Acid) result 5.62mmol/L given to Dr. Ranae Palms

## 2012-12-25 MED ORDER — METOCLOPRAMIDE HCL 5 MG/ML IJ SOLN
5.0000 mg | Freq: Three times a day (TID) | INTRAMUSCULAR | Status: DC
  Administered 2012-12-25 (×2): 5 mg via INTRAVENOUS
  Filled 2012-12-25 (×2): qty 2

## 2012-12-25 MED ORDER — PROMETHAZINE HCL 25 MG RE SUPP: 25.0000 mg | Freq: Four times a day (QID) | RECTAL | Status: DC | PRN

## 2012-12-25 MED ORDER — SODIUM CHLORIDE 0.9 % IV SOLN
INTRAVENOUS | Status: DC
  Administered 2012-12-25 – 2012-12-26 (×3): via INTRAVENOUS

## 2012-12-25 MED ORDER — METOCLOPRAMIDE HCL 5 MG/ML IJ SOLN
10.0000 mg | Freq: Three times a day (TID) | INTRAMUSCULAR | Status: DC
  Administered 2012-12-25 – 2012-12-26 (×3): 10 mg via INTRAVENOUS
  Filled 2012-12-25 (×5): qty 2

## 2012-12-25 MED ORDER — ONDANSETRON HCL 4 MG/2ML IJ SOLN
4.0000 mg | Freq: Four times a day (QID) | INTRAMUSCULAR | Status: DC | PRN
  Administered 2012-12-25 (×2): 4 mg via INTRAVENOUS
  Filled 2012-12-25: qty 2

## 2012-12-25 MED ORDER — MORPHINE SULFATE 2 MG/ML IJ SOLN
1.0000 mg | Freq: Once | INTRAMUSCULAR | Status: AC
  Administered 2012-12-25: 1 mg via INTRAVENOUS
  Filled 2012-12-25: qty 1

## 2012-12-25 MED ORDER — NICOTINE 21 MG/24HR TD PT24
21.0000 mg | MEDICATED_PATCH | Freq: Every day | TRANSDERMAL | Status: DC
  Administered 2012-12-25: 21 mg via TRANSDERMAL
  Filled 2012-12-25 (×2): qty 1

## 2012-12-25 MED ORDER — PROMETHAZINE HCL 25 MG PO TABS: 25.0000 mg | ORAL_TABLET | Freq: Four times a day (QID) | ORAL | Status: DC | PRN

## 2012-12-25 MED ORDER — ONDANSETRON HCL 4 MG/2ML IJ SOLN
4.0000 mg | INTRAMUSCULAR | Status: DC | PRN
  Administered 2012-12-26: 4 mg via INTRAVENOUS
  Filled 2012-12-25: qty 2

## 2012-12-25 MED ORDER — HYDROXYZINE HCL 50 MG PO TABS
50.0000 mg | ORAL_TABLET | Freq: Every day | ORAL | Status: AC
  Administered 2012-12-25: 50 mg via ORAL
  Filled 2012-12-25: qty 1

## 2012-12-25 MED ORDER — PANTOPRAZOLE SODIUM 40 MG IV SOLR
40.0000 mg | Freq: Two times a day (BID) | INTRAVENOUS | Status: DC
  Administered 2012-12-25: 40 mg via INTRAVENOUS
  Filled 2012-12-25 (×3): qty 40

## 2012-12-25 MED ORDER — LORAZEPAM 2 MG/ML IJ SOLN
1.0000 mg | Freq: Once | INTRAMUSCULAR | Status: AC
  Administered 2012-12-25: 1 mg via INTRAVENOUS
  Filled 2012-12-25: qty 1

## 2012-12-25 MED ORDER — METOCLOPRAMIDE HCL 5 MG/ML IJ SOLN
10.0000 mg | Freq: Once | INTRAMUSCULAR | Status: AC
  Administered 2012-12-25: 10 mg via INTRAVENOUS
  Filled 2012-12-25: qty 2

## 2012-12-25 MED ORDER — ONDANSETRON HCL 4 MG/2ML IJ SOLN
INTRAMUSCULAR | Status: AC
  Filled 2012-12-25: qty 2

## 2012-12-25 MED ORDER — HYDROXYZINE HCL 25 MG PO TABS: 25.0000 mg | ORAL_TABLET | Freq: Three times a day (TID) | ORAL | Status: DC | PRN

## 2012-12-25 NOTE — Progress Notes (Signed)
[facility] PROGRESS NOTE  Prynce Jacober ZOX:[id number] DOB: 1985-12-23 DOA: 12/23/2012 PCP: No PCP Per Patient  Assessment/Plan: Principal Problem:  Cyclic vomiting syndrome  -secondary to marijuana abuse -worsening vomiting this am- will add reglan, and increase zofran frequency -continue supportive care, pt counseled to quit marijuana>> will consult SW for resources to quit Active Problems:  Cannabis abuse with intoxication with complication -heavy marijuana use, counseled to quit>> will consult SW for resources to quit as above   Code Status: full Family Communication: none at bedside Disposition Plan: to home when medically stable   Consultants:  none  Procedures:  none  Antibiotics:  none  HPI/Subjective: Did well last pm but with increase n/v/abd pain this am  Objective: Filed Vitals:   12/25/12 0536  BP: 121/73  Pulse: 85  Temp: 98.1 F (36.7 C)  Resp: 18   No intake or output data in the 24 hours ending 12/25/12 1242 Filed Weights   12/24/12 0431  Weight: 90.719 kg (200 lb)    Exam:  General: alert & oriented x 3 In NAD Cardiovascular: RRR, nl S1 s2 Respiratory: CTAB Abdomen: soft +BS mild diffuse tenderness, ND, no masses palpable Extremities: No cyanosis and no edema    Data Reviewed: Basic Metabolic Panel:  Recent Labs Lab 12/23/12 2310  NA 139  K 3.8  CL 98  CO2 20  GLUCOSE 195*  BUN 11  CREATININE 0.95  CALCIUM 9.8   Liver Function Tests:  Recent Labs Lab 12/23/12 2310  AST 21  ALT 16  ALKPHOS 69  BILITOT 0.7  PROT 7.6  ALBUMIN 4.7    Recent Labs Lab 12/23/12 2310  LIPASE 23   No results found for this basename: AMMONIA,  in the last 168 hours CBC:  Recent Labs Lab 12/23/12 2310 12/24/12 0935  WBC 18.7* 8.2  NEUTROABS 15.8*  --   HGB 15.8 13.3  HCT 43.6 38.0*  MCV 88.4 88.8  PLT 229 181   Cardiac Enzymes: No results found for this basename: CKTOTAL, CKMB, CKMBINDEX, TROPONINI,  in the last 168  hours BNP (last 3 results) No results found for this basename: PROBNP,  in the last 8760 hours CBG: No results found for this basename: GLUCAP,  in the last 168 hours  No results found for this or any previous visit (from the past 240 hour(s)).   Studies: Dg Abd Acute W/chest  12/24/2012   CLINICAL DATA:  Extreme abdominal pain. Vomiting blood.  EXAM: ACUTE ABDOMEN SERIES (ABDOMEN 2 VIEW & CHEST 1 VIEW)  COMPARISON:  Abdominal CT 11/24/2012  FINDINGS: There is no evidence of dilated bowel loops or free intraperitoneal air. No radiopaque calculi or other significant radiographic abnormality is seen. Heart size and mediastinal contours are within normal limits. Both lungs are clear.  IMPRESSION: Negative abdominal radiographs.  No acute cardiopulmonary disease.   Electronically Signed   By: Tiburcio Pea M.D.   On: 12/24/2012 02:35    Scheduled Meds: . hydrOXYzine  50 mg Oral QHS  . lamoTRIgine  100 mg Oral Daily  . LORazepam  1 mg Intravenous Once  . metoCLOPramide (REGLAN) injection  5 mg Intravenous TID AC & HS   Continuous Infusions: . sodium chloride 100 mL/hr at 12/25/12 8119    Principal Problem:   Cyclic vomiting syndrome Active Problems:   Cannabis abuse with intoxication with complication   Cannabis overdose    Time spent: 12    [facility]  [facility] Pager [phone]. If 7PM-7AM, please contact  night-coverage at [email or web address], password [facility] 12/25/2012, 12:42 PM  LOS: 2 days

## 2012-12-25 NOTE — Progress Notes (Signed)
Pt was feeling well this AM.  He tried a little clear liquids for breakfast.  Started feeling some nausea and he took at warm shower, since the steam has helped his symptoms before.  That didn't help.  Tried PO Phenergan as ordered, shortly after patient began vomiting.  The emesis was mostly orange and liquid with a few "chunks".  His vomiting episode is very forceful and eventually turned into lots of coughing and heaving and spitting. Notified Dr Donna Bernard and new orders received.  Will continue to monitor

## 2012-12-26 DIAGNOSIS — E876 Hypokalemia: Secondary | ICD-10-CM

## 2012-12-26 LAB — BASIC METABOLIC PANEL
GFR calc Af Amer: >90 mL/min (ref >90)
GFR calc non Af Amer: >90 mL/min (ref >90)
Potassium: 3.2 mEq/L — ABNORMAL LOW (ref 3.5–5.1)
Sodium: 138 mEq/L (ref 135–145)

## 2012-12-26 MED ORDER — METOCLOPRAMIDE HCL 10 MG PO TABS: 10.0000 mg | ORAL_TABLET | Freq: Four times a day (QID) | ORAL | Status: DC | PRN

## 2012-12-26 MED ORDER — POTASSIUM CHLORIDE CRYS ER 20 MEQ PO TBCR
60.0000 meq | EXTENDED_RELEASE_TABLET | Freq: Once | ORAL | Status: AC
  Administered 2012-12-26: 60 meq via ORAL
  Filled 2012-12-26: qty 3

## 2012-12-26 MED ORDER — ONDANSETRON 4 MG PO TBDP: 4.0000 mg | ORAL_TABLET | Freq: Three times a day (TID) | ORAL | Status: DC | PRN

## 2012-12-26 MED ORDER — OMEPRAZOLE 40 MG PO CPDR: 40.0000 mg | DELAYED_RELEASE_CAPSULE | Freq: Every day | ORAL | Status: DC

## 2012-12-26 NOTE — Discharge Summary (Addendum)
Physician Discharge Summary  [name] ZOX:[id number] DOB: 23-Sep-1985 DOA: 12/23/2012  PCP: No PCP Per Patient  Admit date: 12/23/2012 Discharge date: 12/26/2012  Time spent: >30 minutes  Recommendations for Outpatient Follow-up:  Follow-up Information   Please follow up. (PCP in 1-2weeks, call for appt upon discharge)       Discharge Diagnoses:  Principal Problem:   Cyclic vomiting syndrome Active Problems:   Cannabis abuse with intoxication with complication   Cannabis overdose   Discharge Condition: Improved/stable  Diet recommendation: Regular  Filed Weights   12/24/12 0431  Weight: 90.719 kg (200 lb)    History of present illness:  Preston Brewer is a 27 y.o. male who presents with acute onset, severe, nausea and vomiting. Just as symptoms occurred last month during his ED stay, his symptoms were initially severe, associated with a lactic acidosis, but spontaneously resolved after a couple of hours while in the ED. His vomit is blood streaked due to the severity of his vomiting.  The patient before I can ask about what makes symptoms better or worse, the patient volunteers that hot showers are the only thing that seems to make his symptoms better. After he states this, I have his father leave the room for privacy concerns, and then ask the patient how much Cannabis he has been smoking. He freely admits to smoking cannabis and when asked how much states "probably enough to kill a person". When asked to clarify he states this is about 8 blunts a day.   Hospital Course:  Principal Problem:  Cyclic vomiting syndrome  -secondary to marijuana abuse  -As discussed above, upon admission patient was managed reportedly-IV fluids/ antiemetics/pain management. He had episode of hematemesis earlier on but never had any further and his hgb was monitored and remained stable. -He appear to improve initially but his symptoms worsened and so Reglan added and antiemetic frequency increased.  This interventions his symptoms improved and he was started on by mouth which he is tolerating  - pt counseled to quit marijuana>>and SW consulted for resources to quit  -He is clinically improved/medically stable for discharge at this time and is to follow up outpt Active Problems:  Cannabis abuse with intoxication with complication  -heavy marijuana use, counseled to quit>>and SW consulted for resources to quit  as above -He is to follow up outpatient Hypokalemia -His potassium was replaced in the hospital Procedures: none Consultations:  none  Discharge Exam: Filed Vitals:   12/26/12 0542  BP: 115/62  Pulse: 78  Temp: 98 F (36.7 C)  Resp: 18   Exam:  General: alert & oriented x 3 In NAD  Cardiovascular: RRR, nl S1 s2  Respiratory: CTAB  Abdomen: soft +BS NT, ND, no masses palpable  Extremities: No cyanosis and no edema   Discharge Instructions  Discharge Orders   Future Orders Complete By Expires   Diet general  As directed    Increase activity slowly  As directed        Medication List         calcium carbonate 750 MG chewable tablet  Commonly known as:  TUMS EX  Chew 4 tablets by mouth as needed for heartburn.     lamoTRIgine 100 MG tablet  Commonly known as:  LAMICTAL  Take 100 mg by mouth daily.     metoCLOPramide 10 MG tablet  Commonly known as:  REGLAN  Take 1 tablet (10 mg total) by mouth 4 (four) times daily as needed (as needed for nausea/vomiting  before meals and at bedtime).     omeprazole 40 MG capsule  Commonly known as:  PRILOSEC  Take 1 capsule (40 mg total) by mouth daily.     ondansetron 4 MG disintegrating tablet  Commonly known as:  ZOFRAN ODT  Take 1 tablet (4 mg total) by mouth every 8 (eight) hours as needed for nausea.     promethazine 25 MG tablet  Commonly known as:  PHENERGAN  Take 1 tablet (25 mg total) by mouth every 6 (six) hours as needed for nausea.     VISTARIL PO  Take 1 tablet by mouth daily.       No Known  Allergies     Follow-up Information   Please follow up. (PCP in 1-2weeks, call for appt upon discharge)        The results of significant diagnostics from this hospitalization (including imaging, microbiology, ancillary and laboratory) are listed below for reference.    Significant Diagnostic Studies: Dg Abd Acute W/chest  12/24/2012   CLINICAL DATA:  Extreme abdominal pain. Vomiting blood.  EXAM: ACUTE ABDOMEN SERIES (ABDOMEN 2 VIEW & CHEST 1 VIEW)  COMPARISON:  Abdominal CT 11/24/2012  FINDINGS: There is no evidence of dilated bowel loops or free intraperitoneal air. No radiopaque calculi or other significant radiographic abnormality is seen. Heart size and mediastinal contours are within normal limits. Both lungs are clear.  IMPRESSION: Negative abdominal radiographs.  No acute cardiopulmonary disease.   Electronically Signed   By: Tiburcio Pea M.D.   On: 12/24/2012 02:35    Microbiology: No results found for this or any previous visit (from the past 240 hour(s)).   Labs: Basic Metabolic Panel:  Recent Labs Lab 12/23/12 2310 12/26/12 0655  NA 139 138  K 3.8 3.2*  CL 98 103  CO2 20 25  GLUCOSE 195* 88  BUN 11 7  CREATININE 0.95 0.79  CALCIUM 9.8 8.7   Liver Function Tests:  Recent Labs Lab 12/23/12 2310  AST 21  ALT 16  ALKPHOS 69  BILITOT 0.7  PROT 7.6  ALBUMIN 4.7    Recent Labs Lab 12/23/12 2310  LIPASE 23   No results found for this basename: AMMONIA,  in the last 168 hours CBC:  Recent Labs Lab 12/23/12 2310 12/24/12 0935  WBC 18.7* 8.2  NEUTROABS 15.8*  --   HGB 15.8 13.3  HCT 43.6 38.0*  MCV 88.4 88.8  PLT 229 181   Cardiac Enzymes: No results found for this basename: CKTOTAL, CKMB, CKMBINDEX, TROPONINI,  in the last 168 hours BNP: BNP (last 3 results) No results found for this basename: PROBNP,  in the last 8760 hours CBG: No results found for this basename: GLUCAP,  in the last 168 hours     Signed:  Kela Millin  [facility] 12/26/2012, 12:34 PM

## 2012-12-26 NOTE — Progress Notes (Signed)
Pt discharged to home

## 2013-07-04 ENCOUNTER — Emergency Department (HOSPITAL_COMMUNITY): Payer: Managed Care, Other (non HMO)

## 2013-07-04 ENCOUNTER — Emergency Department (HOSPITAL_COMMUNITY)
Admission: EM | Admit: 2013-07-04 | Discharge: 2013-07-04 | Disposition: A | Discharge status: Home / Self Care | Payer: Managed Care, Other (non HMO) | Attending: Emergency Medicine | Admitting: Emergency Medicine

## 2013-07-04 ENCOUNTER — Encounter (HOSPITAL_COMMUNITY): Payer: Self-pay | Admitting: Emergency Medicine

## 2013-07-04 DIAGNOSIS — K2970 Gastritis, unspecified, without bleeding: Secondary | ICD-10-CM

## 2013-07-04 DIAGNOSIS — F319 Bipolar disorder, unspecified: Secondary | ICD-10-CM | POA: Insufficient documentation

## 2013-07-04 DIAGNOSIS — F1010 Alcohol abuse, uncomplicated: Secondary | ICD-10-CM | POA: Insufficient documentation

## 2013-07-04 DIAGNOSIS — I498 Other specified cardiac arrhythmias: Secondary | ICD-10-CM | POA: Insufficient documentation

## 2013-07-04 DIAGNOSIS — F1210 Cannabis abuse, uncomplicated: Secondary | ICD-10-CM | POA: Insufficient documentation

## 2013-07-04 DIAGNOSIS — R1115 Cyclical vomiting syndrome unrelated to migraine: Secondary | ICD-10-CM | POA: Insufficient documentation

## 2013-07-04 DIAGNOSIS — R748 Abnormal levels of other serum enzymes: Secondary | ICD-10-CM | POA: Insufficient documentation

## 2013-07-04 DIAGNOSIS — Z79899 Other long term (current) drug therapy: Secondary | ICD-10-CM | POA: Insufficient documentation

## 2013-07-04 DIAGNOSIS — R231 Pallor: Secondary | ICD-10-CM | POA: Insufficient documentation

## 2013-07-04 DIAGNOSIS — K2990 Gastroduodenitis, unspecified, without bleeding: Principal | ICD-10-CM

## 2013-07-04 DIAGNOSIS — Z872 Personal history of diseases of the skin and subcutaneous tissue: Secondary | ICD-10-CM | POA: Insufficient documentation

## 2013-07-04 DIAGNOSIS — R6883 Chills (without fever): Secondary | ICD-10-CM | POA: Insufficient documentation

## 2013-07-04 DIAGNOSIS — R61 Generalized hyperhidrosis: Secondary | ICD-10-CM | POA: Insufficient documentation

## 2013-07-04 DIAGNOSIS — F17200 Nicotine dependence, unspecified, uncomplicated: Secondary | ICD-10-CM | POA: Insufficient documentation

## 2013-07-04 LAB — BASIC METABOLIC PANEL
BUN: 10 mg/dL (ref 6–23)
CALCIUM: 10.5 mg/dL (ref 8.4–10.5)
CO2: 18 meq/L — AB (ref 19–32)
CREATININE: 0.87 mg/dL (ref 0.50–1.35)
Chloride: 97 mEq/L (ref 96–112)
GFR calc Af Amer: >90 mL/min (ref >90)
GFR calc non Af Amer: >90 mL/min (ref >90)
GLUCOSE: 184 mg/dL — AB (ref 70–99)
Potassium: 3.5 mEq/L — ABNORMAL LOW (ref 3.7–5.3)
SODIUM: 142 meq/L (ref 137–147)

## 2013-07-04 LAB — URINALYSIS, ROUTINE W REFLEX MICROSCOPIC
Bilirubin Urine: NEGATIVE
Glucose, UA: NEGATIVE mg/dL
Hgb urine dipstick: NEGATIVE
Ketones, ur: 40 mg/dL — AB
LEUKOCYTES UA: NEGATIVE
Nitrite: NEGATIVE
PROTEIN: NEGATIVE mg/dL
Specific Gravity, Urine: 1.018 (ref 1.005–1.030)
UROBILINOGEN UA: 1 mg/dL (ref 0.0–1.0)
pH: 7 (ref 5.0–8.0)

## 2013-07-04 LAB — CBC WITH DIFFERENTIAL/PLATELET
BASOS ABS: 0 10*3/uL (ref 0.0–0.1)
Basophils Relative: 0 % (ref 0–1)
EOS ABS: 0.1 10*3/uL (ref 0.0–0.7)
EOS PCT: 1 % (ref 0–5)
HEMATOCRIT: 47.5 % (ref 39.0–52.0)
Hemoglobin: 17.2 g/dL — ABNORMAL HIGH (ref 13.0–17.0)
LYMPHS PCT: 12 % (ref 12–46)
Lymphs Abs: 1.6 10*3/uL (ref 0.7–4.0)
MCH: 32.7 pg (ref 26.0–34.0)
MCHC: 36.2 g/dL — ABNORMAL HIGH (ref 30.0–36.0)
MCV: 90.3 fL (ref 78.0–100.0)
Monocytes Absolute: 0.9 10*3/uL (ref 0.1–1.0)
Monocytes Relative: 7 % (ref 3–12)
Neutro Abs: 10.9 10*3/uL — ABNORMAL HIGH (ref 1.7–7.7)
Neutrophils Relative %: 80 % — ABNORMAL HIGH (ref 43–77)
PLATELETS: 253 10*3/uL (ref 150–400)
RBC: 5.26 MIL/uL (ref 4.22–5.81)
RDW: 12.7 % (ref 11.5–15.5)
WBC: 13.6 10*3/uL — ABNORMAL HIGH (ref 4.0–10.5)

## 2013-07-04 LAB — I-STAT CHEM 8, ED
BUN: 9 mg/dL (ref 6–23)
Calcium, Ion: 1.16 mmol/L (ref 1.12–1.23)
Chloride: 105 mEq/L (ref 96–112)
Creatinine, Ser: 0.7 mg/dL (ref 0.50–1.35)
GLUCOSE: 106 mg/dL — AB (ref 70–99)
HCT: 43 % (ref 39.0–52.0)
Hemoglobin: 14.6 g/dL (ref 13.0–17.0)
Potassium: 4 mEq/L (ref 3.7–5.3)
Sodium: 143 mEq/L (ref 137–147)
TCO2: 23 mmol/L (ref 0–100)

## 2013-07-04 LAB — LIPASE, BLOOD: LIPASE: 90 U/L — AB (ref 11–59)

## 2013-07-04 MED ORDER — SODIUM CHLORIDE 0.9 % IV BOLUS (SEPSIS)
1000.0000 mL | Freq: Once | INTRAVENOUS | Status: AC
  Administered 2013-07-04: 1000 mL via INTRAVENOUS

## 2013-07-04 MED ORDER — PROMETHAZINE HCL 25 MG/ML IJ SOLN
25.0000 mg | INTRAMUSCULAR | Status: AC
  Administered 2013-07-04: 25 mg via INTRAVENOUS
  Filled 2013-07-04: qty 1

## 2013-07-04 MED ORDER — IOHEXOL 300 MG/ML  SOLN
80.0000 mL | Freq: Once | INTRAMUSCULAR | Status: AC | PRN
  Administered 2013-07-04: 80 mL via INTRAVENOUS

## 2013-07-04 MED ORDER — PROMETHAZINE HCL 25 MG PO TABS: 25.0000 mg | ORAL_TABLET | Freq: Four times a day (QID) | ORAL | Status: DC | PRN

## 2013-07-04 MED ORDER — ONDANSETRON 4 MG PO TBDP
4.0000 mg | ORAL_TABLET | Freq: Once | ORAL | Status: AC
  Administered 2013-07-04: 4 mg via ORAL
  Filled 2013-07-04: qty 1

## 2013-07-04 MED ORDER — PROMETHAZINE HCL 25 MG/ML IJ SOLN
25.0000 mg | Freq: Once | INTRAMUSCULAR | Status: AC
  Administered 2013-07-04: 25 mg via INTRAMUSCULAR
  Filled 2013-07-04: qty 1

## 2013-07-04 MED ORDER — MORPHINE SULFATE 4 MG/ML IJ SOLN
4.0000 mg | Freq: Once | INTRAMUSCULAR | Status: AC
  Administered 2013-07-04: 4 mg via INTRAMUSCULAR
  Filled 2013-07-04: qty 1

## 2013-07-04 MED ORDER — MORPHINE SULFATE 4 MG/ML IJ SOLN
4.0000 mg | Freq: Once | INTRAMUSCULAR | Status: AC
  Administered 2013-07-04: 4 mg via INTRAVENOUS
  Filled 2013-07-04: qty 1

## 2013-07-04 MED ORDER — ONDANSETRON HCL 4 MG/2ML IJ SOLN
4.0000 mg | Freq: Once | INTRAMUSCULAR | Status: AC
  Administered 2013-07-04: 4 mg via INTRAVENOUS
  Filled 2013-07-04: qty 2

## 2013-07-04 MED ORDER — FAMOTIDINE IN NACL 20-0.9 MG/50ML-% IV SOLN
20.0000 mg | Freq: Once | INTRAVENOUS | Status: AC
  Administered 2013-07-04: 20 mg via INTRAVENOUS
  Filled 2013-07-04: qty 50

## 2013-07-04 MED ORDER — PROMETHAZINE HCL 25 MG/ML IJ SOLN
25.0000 mg | INTRAMUSCULAR | Status: DC
  Filled 2013-07-04: qty 1

## 2013-07-04 MED ORDER — OMEPRAZOLE 20 MG PO CPDR: 20.0000 mg | DELAYED_RELEASE_CAPSULE | Freq: Every day | ORAL | Status: DC

## 2013-07-04 MED ORDER — IOHEXOL 300 MG/ML  SOLN
25.0000 mL | Freq: Once | INTRAMUSCULAR | Status: AC | PRN
  Administered 2013-07-04: 25 mL via ORAL

## 2013-07-04 MED ORDER — MORPHINE SULFATE 4 MG/ML IJ SOLN: 4.0000 mg | Freq: Once | INTRAMUSCULAR | Status: DC

## 2013-07-04 NOTE — ED Notes (Signed)
Pt st's he became nauseated again after drinking a coke.  Pt did not vomit.  Orders received.

## 2013-07-04 NOTE — ED Provider Notes (Signed)
CSN: [id number]     Arrival date & time 07/04/13  0940 History   First MD Initiated Contact with Patient 07/04/13 1111     Chief Complaint  Patient presents with  . Emesis     (Consider location/radiation/quality/duration/timing/severity/associated sxs/prior Treatment) Patient is a 28 y.o. male presenting with vomiting.  Emesis  28 yo male presents with N/V and epigastric abdominal pain starting at 2am this morning and constant since. Pain is is sharp stabbing burning pain without radiation rated 8/10. Patient diaphoretic with chills. Denies HA, Dizziness, CP, SOB, DIarrhea, Constipation, or urinary symptoms. Patient is a daily alcohol user since July 2014, with approximately 6-12 beers per day. Patient states he had 8 beers, a couple shots, and smoked marijuana last night prior to the onset of symptoms.  Past Medical History  Diagnosis Date  . Bipolar 1 disorder   . GI bleed   . H/O endoscopy   . Ulcer    History reviewed. No pertinent past surgical history. History reviewed. No pertinent family history. History  Substance Use Topics  . Smoking status: Current Every Day Smoker -- 1.00 packs/day    Types: Cigarettes  . Smokeless tobacco: Not on file  . Alcohol Use: Yes    Review of Systems  Gastrointestinal: Positive for vomiting. Negative for blood in stool and anal bleeding.  All other systems reviewed and are negative.     Allergies  Review of patient's allergies indicates no known allergies.  Home Medications   Prior to Admission medications   Medication Sig Start Date End Date Taking? Authorizing Provider  calcium carbonate (TUMS EX) 750 MG chewable tablet Chew 1-2 tablets by mouth daily as needed for heartburn.    Yes Historical Provider, MD  lamoTRIgine (LAMICTAL) 100 MG tablet Take 100 mg by mouth daily.   Yes Historical Provider, MD   BP 126/73  Pulse 94  Temp(Src) 98.1 F (36.7 C) (Oral)  Resp 17  Ht 6\' 1"  (1.854 m)  Wt 182 lb 2 oz (82.611 kg)  BMI  24.03 kg/m2  SpO2 100% Physical Exam  Nursing note and vitals reviewed. Constitutional: He is oriented to person, place, and time. He appears well-developed and well-nourished. He appears ill.  HENT:  Head: Normocephalic and atraumatic.  Nose: Nose normal.  Mouth/Throat: Uvula is midline. Mucous membranes are dry. No oropharyngeal exudate, posterior oropharyngeal edema or posterior oropharyngeal erythema.  Neck: Normal range of motion. Neck supple. No JVD present. No tracheal deviation present.  Cardiovascular: Regular rhythm.  Bradycardia present.  Exam reveals no gallop and no friction rub.   No murmur heard. Pulmonary/Chest: Effort normal. No respiratory distress. He has no wheezes. He has no rhonchi. He has no rales.  Abdominal: He exhibits no distension and no mass. Bowel sounds are decreased. There is tenderness (diffusely tender, worse in epigastrum ).    Musculoskeletal: He exhibits no edema.  Neurological: He is alert and oriented to person, place, and time.  Skin: Skin is warm. He is diaphoretic. There is pallor.  Patient diaphoretic, ill appearing, with pale clammy skin and piloerection.   Psychiatric: He has a normal mood and affect. His behavior is normal.    ED Course  Procedures (including critical care time) Labs Review Labs Reviewed  CBC WITH DIFFERENTIAL - Abnormal; Notable for the following:    WBC 13.6 (*)    Hemoglobin 17.2 (*)    MCHC 36.2 (*)    Neutrophils Relative % 80 (*)    Neutro Abs 10.9 (*)  All other components within normal limits  BASIC METABOLIC PANEL - Abnormal; Notable for the following:    Potassium 3.5 (*)    CO2 18 (*)    Glucose, Bld 184 (*)    All other components within normal limits  LIPASE, BLOOD - Abnormal; Notable for the following:    Lipase 90 (*)    All other components within normal limits  URINALYSIS, ROUTINE W REFLEX MICROSCOPIC - Abnormal; Notable for the following:    Ketones, ur 40 (*)    All other components within  normal limits  I-STAT CHEM 8, ED - Abnormal; Notable for the following:    Glucose, Bld 106 (*)    All other components within normal limits    Imaging Review Ct Abdomen Pelvis W Contrast  07/04/2013   CLINICAL DATA:  Nausea and vomiting. Prior diagnosis of a cannabinoid hyperemesis syndrome.  EXAM: CT ABDOMEN AND PELVIS WITH CONTRAST  TECHNIQUE: Multidetector CT imaging of the abdomen and pelvis was performed using the standard protocol following bolus administration of intravenous contrast.  CONTRAST:  80mL OMNIPAQUE IOHEXOL 300 MG/ML  SOLN  COMPARISON:  Prior CT abdomen/pelvis 11/24/2012  FINDINGS: Lower Chest: The lung bases are clear. Visualized cardiac structures are within normal limits for size. No pericardial effusion. Small hiatal hernia.  Abdomen: Unremarkable CT appearance of the stomach, duodenum, spleen, adrenal glands, pancreas and liver. Gallbladder is unremarkable. No intra or extrahepatic biliary ductal dilatation. Portal vein is widely patent. Unremarkable appearance of the bilateral kidneys. No focal solid lesion, hydronephrosis or nephrolithiasis.  No evidence of obstruction or focal bowel wall thickening. Mild dilatation of the second and third portions of the duodenum likely related to the ingested oral contrast bolus. Contrast is noted in the proximal jejunum. Normal appendix in the right lower quadrant. The terminal ileum is unremarkable. No free fluid or suspicious adenopathy.  Pelvis: Unremarkable bladder, prostate gland and seminal vesicles. No free fluid or suspicious adenopathy.  Bones/Soft Tissues: No acute fracture or aggressive appearing lytic or blastic osseous lesion.  Vascular: No significant atherosclerotic vascular disease, aneurysmal dilatation or acute abnormality.  IMPRESSION: Essentially unremarkable CT scan of the abdomen and pelvis.  There is a very small hiatal hernia which is almost certainly an incidental finding.   Electronically Signed   By: Malachy MoanHeath  McCullough  M.D.   On: 07/04/2013 16:03     EKG Interpretation None      MDM   Final diagnoses:  Gastritis  Cyclic vomiting syndrome   Filed Vitals:   07/04/13 1542 07/04/13 1600 07/04/13 1700 07/04/13 1716  BP: 126/69 116/61 126/73 126/73  Pulse: 95   94  Temp:      TempSrc:      Resp: 20 14 16 17   Height:      Weight:      SpO2: 97% 98% 95% 100%   Vitals Signs stable.  Leukocytosis to 13.6  Hemoconcentration with Hgb of 17.2, likely secondary to dehydration.  Metabolic acidosis with Anion gap of 27 Hyperglycemia at 184.  Lipase elevated at 90, consider possible early pancreatitis.  Patient abdomen diffusely tender with concentration of pain in epigastrum and LUQ.  Will get CT abd/pelvis   Patient given IV fluids, Antiemetics, pain control, and IV previcid.  CT scan shows small hiatal hernia likely incidental finding, otherwise unremarkable.   Upon reexamination patient appears markedly improved with tx received today in the ED.  Patient now with good skin color non-ill appearing and appears much more comfortable. Pain controlled  currently. Patient abdomen is soft and nonsurgical at this time, marked improvement in abdominal exam.  Anion gap acidosis improved to 15.  Patient tolerating POs in ED at this time.  Plan to have patient follow up with PCP in 2 days. Provided antiemetics for home. Recommend omeprazole daily for 1-2 weeks. Recommend slow progression of clear liquid diet as tolerated.  Return precautions given for any worsening abdominal pain, fever/chills, intractable nausea/vomiting, or patient unable to tolerate POs. Patient invited to return to ED for reevaluation at any point if they feel their condition is getting worse. Patient confirms understanding. Agrees with plan. Discharged in good condition.    Meds given in ED:  Medications  ondansetron (ZOFRAN-ODT) disintegrating tablet 4 mg (4 mg Oral Given 07/04/13 1004)  ondansetron (ZOFRAN) injection 4 mg (4 mg  Intravenous Given 07/04/13 1124)  sodium chloride 0.9 % bolus 1,000 mL (0 mLs Intravenous Stopped 07/04/13 1229)  promethazine (PHENERGAN) injection 25 mg (25 mg Intravenous Given 07/04/13 1204)  morphine 4 MG/ML injection 4 mg (4 mg Intravenous Given 07/04/13 1156)  sodium chloride 0.9 % bolus 1,000 mL (0 mLs Intravenous Stopped 07/04/13 1431)  iohexol (OMNIPAQUE) 300 MG/ML solution 25 mL (25 mLs Oral Contrast Given 07/04/13 1349)  morphine 4 MG/ML injection 4 mg (4 mg Intravenous Given 07/04/13 1447)  ondansetron (ZOFRAN) injection 4 mg (4 mg Intravenous Given 07/04/13 1447)  sodium chloride 0.9 % bolus 1,000 mL (0 mLs Intravenous Stopped 07/04/13 1758)  iohexol (OMNIPAQUE) 300 MG/ML solution 80 mL (80 mLs Intravenous Contrast Given 07/04/13 1531)  famotidine (PEPCID) IVPB 20 mg (0 mg Intravenous Stopped 07/04/13 1758)  promethazine (PHENERGAN) injection 25 mg (25 mg Intramuscular Given 07/04/13 1749)  morphine 4 MG/ML injection 4 mg (4 mg Intramuscular Given 07/04/13 1754)    Discharge Medication List as of 07/04/2013  5:28 PM    START taking these medications   Details  omeprazole (PRILOSEC) 20 MG capsule Take 1 capsule (20 mg total) by mouth daily., Starting 07/04/2013, Until Discontinued, Print    promethazine (PHENERGAN) 25 MG tablet Take 1 tablet (25 mg total) by mouth every 6 (six) hours as needed for nausea or vomiting., Starting 07/04/2013, Until Discontinued, Print            Rudene AndaJacob Gray Mylan Lengyel, [place] JerseyPA-C 07/04/13 1909

## 2013-07-04 NOTE — ED Notes (Signed)
Pt returned from CT °

## 2013-07-04 NOTE — ED Notes (Signed)
Pt states when trying to drink the contrast it makes him nauseated when it hits his stomach.

## 2013-07-04 NOTE — ED Notes (Signed)
Pt to CT at this time.

## 2013-07-04 NOTE — ED Notes (Signed)
Pt presents with vomitting X 2:30 this morning. Pt states that was diagnosed. candimoid hyperemesis syndrome and states "I did not believe them but yesterday I smoke pot and I have been sick ever since" pt reports "a lot of episodes" of vomitting" pt denies any diarrhea any fevers. Condition is chronic. Condition is made better by "throwing up" condition is made worse by nothing.

## 2013-07-04 NOTE — Discharge Instructions (Signed)
Take phenergan for nausea. Take omeprazole daily in the morning on empty stomach. Follow up with your doctor in 2 days for reevaluation. Return to Emergency department if you develop any worsening abdominal pain or uncontrollable vomiting.    Gastritis, Adult Gastritis is soreness and puffiness (inflammation) of the lining of the stomach. If you do not get help, gastritis can cause bleeding and sores (ulcers) in the stomach. HOME CARE   Only take medicine as told by your doctor.  If you were given antibiotic medicines, take them as told. Finish the medicines even if you start to feel better.  Drink enough fluids to keep your pee (urine) clear or pale yellow.  Avoid foods and drinks that make your problems worse. Foods you may want to avoid include:  Caffeine or alcohol.  Chocolate.  Mint.  Garlic and onions.  Spicy foods.  Citrus fruits, including oranges, lemons, or limes.  Food containing tomatoes, including sauce, chili, salsa, and pizza.  Fried and fatty foods.  Eat small meals throughout the day instead of large meals. GET HELP RIGHT AWAY IF:   You have black or dark red poop (stools).  You throw up (vomit) blood. It may look like coffee grounds.  You cannot keep fluids down.  Your belly (abdominal) pain gets worse.  You have a fever.  You do not feel better after 1 week.  You have any other questions or concerns. MAKE SURE YOU:   Understand these instructions.  Will watch your condition.  Will get help right away if you are not doing well or get worse. Document Released: 08/26/2007 Document Revised: 06/01/2011 Document Reviewed: 04/22/2011 [facility] Patient Information 2014 [employer], [place].

## 2013-07-04 NOTE — ED Provider Notes (Signed)
Medical screening examination/treatment/procedure(s) were conducted as a shared visit with non-physician practitioner(s) and myself.  I personally evaluated the patient during the encounter.   EKG Interpretation None       Patient has had difficulty with frequent vomiting in the past. Started vomiting this morning at about the 2:00 in the morning. Multiple episodes. Patient here is received 3 L of IV fluids feels much better vomiting is improved but not completely gone lipase was mildly elevated clinically kind of suspicious this may not be pancreatitis. Suspect lipase maybe up just from the frequency of the vomiting. Patient's had no fevers no diarrhea associated with some epigastric abdominal pain. Patient's had this happen before spin diagnosed with hyperemesis. Beverage been told that his had pancreatitis no history of pancreatitis in the past. Eat away patient can be discharged home with anti-medics has enough fluids here now that he can be good for 24 hours he can try small sips of clear liquids at home and advance to bland diet he will return if the vomiting recurs and becomes persistent.   Results for orders placed during the hospital encounter of 07/04/13  CBC WITH DIFFERENTIAL      Result Value Ref Range   WBC 13.6 (*) 4.0 - 10.5 K/uL   RBC 5.26  4.22 - 5.81 MIL/uL   Hemoglobin 17.2 (*) 13.0 - 17.0 g/dL   HCT 16.147.5  09.639.0 - 04.552.0 %   MCV 90.3  78.0 - 100.0 fL   MCH 32.7  26.0 - 34.0 pg   MCHC 36.2 (*) 30.0 - 36.0 g/dL   RDW 40.912.7  81.111.5 - 91.415.5 %   Platelets 253  150 - 400 K/uL   Neutrophils Relative % 80 (*) 43 - 77 %   Neutro Abs 10.9 (*) 1.7 - 7.7 K/uL   Lymphocytes Relative 12  12 - 46 %   Lymphs Abs 1.6  0.7 - 4.0 K/uL   Monocytes Relative 7  3 - 12 %   Monocytes Absolute 0.9  0.1 - 1.0 K/uL   Eosinophils Relative 1  0 - 5 %   Eosinophils Absolute 0.1  0.0 - 0.7 K/uL   Basophils Relative 0  0 - 1 %   Basophils Absolute 0.0  0.0 - 0.1 K/uL  BASIC METABOLIC PANEL      Result  Value Ref Range   Sodium 142  137 - 147 mEq/L   Potassium 3.5 (*) 3.7 - 5.3 mEq/L   Chloride 97  96 - 112 mEq/L   CO2 18 (*) 19 - 32 mEq/L   Glucose, Bld 184 (*) 70 - 99 mg/dL   BUN 10  6 - 23 mg/dL   Creatinine, Ser 7.820.87  0.50 - 1.35 mg/dL   Calcium 95.610.5  8.4 - 21.310.5 mg/dL   GFR calc non Af Amer >90  >90 mL/min   GFR calc Af Amer >90  >90 mL/min  LIPASE, BLOOD      Result Value Ref Range   Lipase 90 (*) 11 - 59 U/L  URINALYSIS, ROUTINE W REFLEX MICROSCOPIC      Result Value Ref Range   Color, Urine YELLOW  YELLOW   APPearance CLEAR  CLEAR   Specific Gravity, Urine 1.018  1.005 - 1.030   pH 7.0  5.0 - 8.0   Glucose, UA NEGATIVE  NEGATIVE mg/dL   Hgb urine dipstick NEGATIVE  NEGATIVE   Bilirubin Urine NEGATIVE  NEGATIVE   Ketones, ur 40 (*) NEGATIVE mg/dL   Protein, ur  NEGATIVE  NEGATIVE mg/dL   Urobilinogen, UA 1.0  0.0 - 1.0 mg/dL   Nitrite NEGATIVE  NEGATIVE   Leukocytes, UA NEGATIVE  NEGATIVE  I-STAT CHEM 8, ED      Result Value Ref Range   Sodium 143  137 - 147 mEq/L   Potassium 4.0  3.7 - 5.3 mEq/L   Chloride 105  96 - 112 mEq/L   BUN 9  6 - 23 mg/dL   Creatinine, Ser 8.110.70  0.50 - 1.35 mg/dL   Glucose, Bld [id number] (*) 70 - 99 mg/dL   Calcium, Ion 7.821.16  9.561.12 - 1.23 mmol/L   TCO2 23  0 - 100 mmol/L   Hemoglobin 14.6  13.0 - 17.0 g/dL   HCT 21.343.0  08.639.0 - 57.852.0 %   Ct Abdomen Pelvis W Contrast  07/04/2013   CLINICAL DATA:  Nausea and vomiting. Prior diagnosis of a cannabinoid hyperemesis syndrome.  EXAM: CT ABDOMEN AND PELVIS WITH CONTRAST  TECHNIQUE: Multidetector CT imaging of the abdomen and pelvis was performed using the standard protocol following bolus administration of intravenous contrast.  CONTRAST:  80mL OMNIPAQUE IOHEXOL 300 MG/ML  SOLN  COMPARISON:  Prior CT abdomen/pelvis 11/24/2012  FINDINGS: Lower Chest: The lung bases are clear. Visualized cardiac structures are within normal limits for size. No pericardial effusion. Small hiatal hernia.  Abdomen: Unremarkable CT  appearance of the stomach, duodenum, spleen, adrenal glands, pancreas and liver. Gallbladder is unremarkable. No intra or extrahepatic biliary ductal dilatation. Portal vein is widely patent. Unremarkable appearance of the bilateral kidneys. No focal solid lesion, hydronephrosis or nephrolithiasis.  No evidence of obstruction or focal bowel wall thickening. Mild dilatation of the second and third portions of the duodenum likely related to the ingested oral contrast bolus. Contrast is noted in the proximal jejunum. Normal appendix in the right lower quadrant. The terminal ileum is unremarkable. No free fluid or suspicious adenopathy.  Pelvis: Unremarkable bladder, prostate gland and seminal vesicles. No free fluid or suspicious adenopathy.  Bones/Soft Tissues: No acute fracture or aggressive appearing lytic or blastic osseous lesion.  Vascular: No significant atherosclerotic vascular disease, aneurysmal dilatation or acute abnormality.  IMPRESSION: Essentially unremarkable CT scan of the abdomen and pelvis.  There is a very small hiatal hernia which is almost certainly an incidental finding.   Electronically Signed   By: Malachy MoanHeath  McCullough M.D.   On: 07/04/2013 16:03      Shelda JakesScott W. Kipton Skillen, MD 07/04/13 [phone]

## 2013-10-05 ENCOUNTER — Emergency Department (HOSPITAL_COMMUNITY): Payer: Managed Care, Other (non HMO)

## 2013-10-05 ENCOUNTER — Observation Stay (HOSPITAL_COMMUNITY)
Admission: EM | Admit: 2013-10-05 | Discharge: 2013-10-07 | Disposition: A | Discharge status: Home / Self Care | Payer: Managed Care, Other (non HMO) | Attending: Internal Medicine | Admitting: Internal Medicine

## 2013-10-05 ENCOUNTER — Encounter (HOSPITAL_COMMUNITY): Payer: Self-pay | Admitting: Emergency Medicine

## 2013-10-05 DIAGNOSIS — R1012 Left upper quadrant pain: Principal | ICD-10-CM | POA: Diagnosis present

## 2013-10-05 DIAGNOSIS — R109 Unspecified abdominal pain: Secondary | ICD-10-CM

## 2013-10-05 DIAGNOSIS — F17200 Nicotine dependence, unspecified, uncomplicated: Secondary | ICD-10-CM | POA: Insufficient documentation

## 2013-10-05 DIAGNOSIS — F319 Bipolar disorder, unspecified: Secondary | ICD-10-CM | POA: Insufficient documentation

## 2013-10-05 DIAGNOSIS — R1115 Cyclical vomiting syndrome unrelated to migraine: Secondary | ICD-10-CM

## 2013-10-05 DIAGNOSIS — F1210 Cannabis abuse, uncomplicated: Secondary | ICD-10-CM | POA: Insufficient documentation

## 2013-10-05 DIAGNOSIS — D72829 Elevated white blood cell count, unspecified: Secondary | ICD-10-CM | POA: Insufficient documentation

## 2013-10-05 DIAGNOSIS — R112 Nausea with vomiting, unspecified: Secondary | ICD-10-CM | POA: Diagnosis present

## 2013-10-05 DIAGNOSIS — K920 Hematemesis: Secondary | ICD-10-CM

## 2013-10-05 DIAGNOSIS — I498 Other specified cardiac arrhythmias: Secondary | ICD-10-CM | POA: Insufficient documentation

## 2013-10-05 LAB — COMPREHENSIVE METABOLIC PANEL
ALT: 15 U/L (ref 0–53)
AST: 23 U/L (ref 0–37)
Albumin: 5.4 g/dL — ABNORMAL HIGH (ref 3.5–5.2)
Alkaline Phosphatase: 62 U/L (ref 39–117)
Anion gap: 21 — ABNORMAL HIGH (ref 5–15)
BUN: 10 mg/dL (ref 6–23)
CO2: 21 mEq/L (ref 19–32)
CREATININE: 0.8 mg/dL (ref 0.50–1.35)
Calcium: 11 mg/dL — ABNORMAL HIGH (ref 8.4–10.5)
Chloride: 99 mEq/L (ref 96–112)
GFR calc non Af Amer: >90 mL/min (ref >90)
Glucose, Bld: 166 mg/dL — ABNORMAL HIGH (ref 70–99)
Potassium: 3.8 mEq/L (ref 3.7–5.3)
SODIUM: 141 meq/L (ref 137–147)
TOTAL PROTEIN: 8.5 g/dL — AB (ref 6.0–8.3)
Total Bilirubin: 0.9 mg/dL (ref 0.3–1.2)

## 2013-10-05 LAB — URINALYSIS, ROUTINE W REFLEX MICROSCOPIC
Bilirubin Urine: NEGATIVE
GLUCOSE, UA: NEGATIVE mg/dL
Hgb urine dipstick: NEGATIVE
KETONES UR: NEGATIVE mg/dL
LEUKOCYTES UA: NEGATIVE
NITRITE: NEGATIVE
PH: 7.5 (ref 5.0–8.0)
PROTEIN: NEGATIVE mg/dL
Specific Gravity, Urine: 1.011 (ref 1.005–1.030)
Urobilinogen, UA: 0.2 mg/dL (ref 0.0–1.0)

## 2013-10-05 LAB — LIPASE, BLOOD: Lipase: 28 U/L (ref 11–59)

## 2013-10-05 LAB — CBC WITH DIFFERENTIAL/PLATELET
BASOS ABS: 0 10*3/uL (ref 0.0–0.1)
BASOS PCT: 0 % (ref 0–1)
Eosinophils Absolute: 0 10*3/uL (ref 0.0–0.7)
Eosinophils Relative: 0 % (ref 0–5)
HEMATOCRIT: 44.7 % (ref 39.0–52.0)
Hemoglobin: 15.3 g/dL (ref 13.0–17.0)
Lymphocytes Relative: 6 % — ABNORMAL LOW (ref 12–46)
Lymphs Abs: 0.9 10*3/uL (ref 0.7–4.0)
MCH: 30.7 pg (ref 26.0–34.0)
MCHC: 34.2 g/dL (ref 30.0–36.0)
MCV: 89.6 fL (ref 78.0–100.0)
MONO ABS: 0.9 10*3/uL (ref 0.1–1.0)
Monocytes Relative: 6 % (ref 3–12)
NEUTROS PCT: 88 % — AB (ref 43–77)
Neutro Abs: 13.3 10*3/uL — ABNORMAL HIGH (ref 1.7–7.7)
Platelets: 251 10*3/uL (ref 150–400)
RBC: 4.99 MIL/uL (ref 4.22–5.81)
RDW: 12.9 % (ref 11.5–15.5)
WBC: 15.1 10*3/uL — ABNORMAL HIGH (ref 4.0–10.5)

## 2013-10-05 LAB — TROPONIN I

## 2013-10-05 LAB — POC OCCULT BLOOD, ED: FECAL OCCULT BLD: POSITIVE — AB

## 2013-10-05 MED ORDER — SODIUM CHLORIDE 0.9 % IV BOLUS (SEPSIS)
1000.0000 mL | Freq: Once | INTRAVENOUS | Status: AC
  Administered 2013-10-05: 1000 mL via INTRAVENOUS

## 2013-10-05 MED ORDER — ONDANSETRON 8 MG PO TBDP
8.0000 mg | ORAL_TABLET | Freq: Once | ORAL | Status: AC
  Administered 2013-10-05: 8 mg via ORAL
  Filled 2013-10-05: qty 1

## 2013-10-05 MED ORDER — PROMETHAZINE HCL 25 MG/ML IJ SOLN
12.5000 mg | Freq: Once | INTRAMUSCULAR | Status: AC
  Administered 2013-10-05: 12.5 mg via INTRAVENOUS
  Filled 2013-10-05: qty 1

## 2013-10-05 MED ORDER — MORPHINE SULFATE 4 MG/ML IJ SOLN
4.0000 mg | Freq: Once | INTRAMUSCULAR | Status: AC
  Administered 2013-10-05: 4 mg via INTRAVENOUS
  Filled 2013-10-05: qty 1

## 2013-10-05 MED ORDER — ONDANSETRON HCL 4 MG/2ML IJ SOLN
4.0000 mg | Freq: Once | INTRAMUSCULAR | Status: AC
  Administered 2013-10-05: 4 mg via INTRAVENOUS
  Filled 2013-10-05: qty 2

## 2013-10-05 NOTE — ED Notes (Signed)
Pt actively vomiting- cannot obtain labs at the moment.

## 2013-10-05 NOTE — ED Notes (Signed)
Pt. On cardiac monitor. 

## 2013-10-05 NOTE — ED Notes (Signed)
Pt states he has been battling severe n/v/d intermittently for the last 3 months. [facility] 3 months [facility] but nothing was found. States he started vomiting around 0900 and is actively vomiting upon assessment. Denies fever.

## 2013-10-05 NOTE — ED Notes (Signed)
Notified RN, Jari Favrescar pt. Heart rate low. Pt. Heart rate 40.

## 2013-10-05 NOTE — ED Provider Notes (Signed)
CSN: [id number]     Arrival date & time 10/05/13  1915 History   First MD Initiated Contact with Patient 10/05/13 1949     Chief Complaint  Patient presents with  . Emesis     (Consider location/radiation/quality/duration/timing/severity/associated sxs/prior Treatment) The history is provided by the patient. No language interpreter was used.  Preston Brewer is a 28 y/o M with PMHx of GI bleed, bipolar disorder, ulcer presenting to the ED with nausea and emesis that started this morning at approximately 9:00AM. Patient reported that he has been having episodes of emesis that are countless. Stated that he has been unable to keep any food or fluids down. Stated that 3 months ago he had similar symptoms, but reported that he was worked up in the ED with negative findings. Patient reported that he did not take anything for the nausea. Patient reported that he has been experiencing abdominal pain that is described as a sharp shooting pain without radiation - generalized. Stated that he noticed some dark blood in his emesis. Reported that he smokes approximately 2 joints of marijuana everyday. Denied fever, chills, chest pain, shortness of breath, difficulty breathing, melena, hematochezia, weakness, numbness, tingling, weakness, penile discomfort.  PCP none   Past Medical History  Diagnosis Date  . Bipolar 1 disorder   . GI bleed   . H/O endoscopy   . Ulcer    History reviewed. No pertinent past surgical history. No family history on file. History  Substance Use Topics  . Smoking status: Current Every Day Smoker -- 1.00 packs/day    Types: Cigarettes  . Smokeless tobacco: Never Used  . Alcohol Use: 3.0 oz/week    5 Cans of beer per week    Review of Systems  Constitutional: Negative for fever and chills.  Respiratory: Negative for chest tightness and shortness of breath.   Cardiovascular: Negative for chest pain.  Gastrointestinal: Positive for nausea, vomiting and abdominal pain.  Negative for diarrhea, constipation, blood in stool and anal bleeding.  Genitourinary: Negative for dysuria, hematuria and decreased urine volume.  Musculoskeletal: Negative for back pain and neck pain.      Allergies  Review of patient's allergies indicates no known allergies.  Home Medications   Prior to Admission medications   Medication Sig Start Date End Date Taking? Authorizing Provider  lamoTRIgine (LAMICTAL) 100 MG tablet Take 100 mg by mouth daily.   Yes Historical Provider, MD   BP 123/73  Pulse 39  Temp(Src) 97.8 F (36.6 C) (Oral)  Resp 17  SpO2 100% Physical Exam  Nursing note and vitals reviewed. Constitutional: He is oriented to person, place, and time. He appears well-developed and well-nourished. No distress.  HENT:  Head: Normocephalic and atraumatic.  Mouth/Throat: Oropharynx is clear and moist. No oropharyngeal exudate.  Eyes: Conjunctivae and EOM are normal. Pupils are equal, round, and reactive to light. Right eye exhibits no discharge. Left eye exhibits no discharge.  Neck: Normal range of motion. Neck supple. No tracheal deviation present.  Cardiovascular: Regular rhythm and normal heart sounds.  Exam reveals no friction rub.   No murmur heard. Tachycardic upon auscultation  Cap refill < 3 seconds  Pulmonary/Chest: Effort normal and breath sounds normal. No respiratory distress. He has no wheezes. He has no rales.  Abdominal: Soft. Bowel sounds are normal. He exhibits no distension. There is tenderness. There is no rebound and no guarding.  Negative abdominal distension noted BS normoactive in all 4 quadrants  Abdomen soft upon palpation  Voluntary guarding -  generalized abdomen tenderness Negative peritoneal signs  Musculoskeletal: Normal range of motion.  Lymphadenopathy:    He has no cervical adenopathy.  Neurological: He is alert and oriented to person, place, and time. No cranial nerve deficit. He exhibits normal muscle tone. Coordination  normal.  Skin: Skin is warm. He is diaphoretic.  Psychiatric: He has a normal mood and affect. His behavior is normal. Thought content normal.    ED Course  Procedures (including critical care time)  CLINICAL DATA: Nausea and vomiting. Prior diagnosis of a  cannabinoid hyperemesis syndrome.  EXAM:  CT ABDOMEN AND PELVIS WITH CONTRAST  TECHNIQUE:  Multidetector CT imaging of the abdomen and pelvis was performed  using the standard protocol following bolus administration of  intravenous contrast.  CONTRAST: 80mL OMNIPAQUE IOHEXOL 300 MG/ML SOLN  COMPARISON: Prior CT abdomen/pelvis 11/24/2012  FINDINGS:  Lower Chest: The lung bases are clear. Visualized cardiac structures  are within normal limits for size. No pericardial effusion. Small  hiatal hernia.  Abdomen: Unremarkable CT appearance of the stomach, duodenum,  spleen, adrenal glands, pancreas and liver. Gallbladder is  unremarkable. No intra or extrahepatic biliary ductal dilatation.  Portal vein is widely patent. Unremarkable appearance of the  bilateral kidneys. No focal solid lesion, hydronephrosis or  nephrolithiasis.  No evidence of obstruction or focal bowel wall thickening. Mild  dilatation of the second and third portions of the duodenum likely  related to the ingested oral contrast bolus. Contrast is noted in  the proximal jejunum. Normal appendix in the right lower quadrant.  The terminal ileum is unremarkable. No free fluid or suspicious  adenopathy.  Pelvis: Unremarkable bladder, prostate gland and seminal vesicles.  No free fluid or suspicious adenopathy.  Bones/Soft Tissues: No acute fracture or aggressive appearing lytic  or blastic osseous lesion.  Vascular: No significant atherosclerotic vascular disease,  aneurysmal dilatation or acute abnormality.  IMPRESSION:  Essentially unremarkable CT scan of the abdomen and pelvis.  There is a very small hiatal hernia which is almost certainly an  incidental  finding.  11:14 PM This provider was made aware by nurse that patient became bradycardic with heart rate as low as 33 bpm. This provider and attending physician, Dr. Raenette Rover, at bedside assessing patient. EKG captured. Cardiology consult ordered.   11:27 PM This provider spoke with Dr. Ivin Poot, Cardiology - reported that this is normal for young individual to have a low heart rate that is asymptomatic. Will consult if needed.   Results for orders placed during the hospital encounter of 10/05/13  COMPREHENSIVE METABOLIC PANEL      Result Value Ref Range   Sodium 141  137 - 147 mEq/L   Potassium 3.8  3.7 - 5.3 mEq/L   Chloride 99  96 - 112 mEq/L   CO2 21  19 - 32 mEq/L   Glucose, Bld 166 (*) 70 - 99 mg/dL   BUN 10  6 - 23 mg/dL   Creatinine, Ser 1.61  0.50 - 1.35 mg/dL   Calcium 09.6 (*) 8.4 - 10.5 mg/dL   Total Protein 8.5 (*) 6.0 - 8.3 g/dL   Albumin 5.4 (*) 3.5 - 5.2 g/dL   AST 23  0 - 37 U/L   ALT 15  0 - 53 U/L   Alkaline Phosphatase 62  39 - 117 U/L   Total Bilirubin 0.9  0.3 - 1.2 mg/dL   GFR calc non Af Amer >90  >90 mL/min   GFR calc Af Amer >90  >90 mL/min  Anion gap 21 (*) 5 - 15  LIPASE, BLOOD      Result Value Ref Range   Lipase 28  11 - 59 U/L  URINALYSIS, ROUTINE W REFLEX MICROSCOPIC      Result Value Ref Range   Color, Urine YELLOW  YELLOW   APPearance CLOUDY (*) CLEAR   Specific Gravity, Urine 1.011  1.005 - 1.030   pH 7.5  5.0 - 8.0   Glucose, UA NEGATIVE  NEGATIVE mg/dL   Hgb urine dipstick NEGATIVE  NEGATIVE   Bilirubin Urine NEGATIVE  NEGATIVE   Ketones, ur NEGATIVE  NEGATIVE mg/dL   Protein, ur NEGATIVE  NEGATIVE mg/dL   Urobilinogen, UA 0.2  0.0 - 1.0 mg/dL   Nitrite NEGATIVE  NEGATIVE   Leukocytes, UA NEGATIVE  NEGATIVE  CBC WITH DIFFERENTIAL      Result Value Ref Range   WBC 15.1 (*) 4.0 - 10.5 K/uL   RBC 4.99  4.22 - 5.81 MIL/uL   Hemoglobin 15.3  13.0 - 17.0 g/dL   HCT 16.1  09.6 - 04.5 %   MCV 89.6  78.0 - 100.0 fL   MCH 30.7  26.0 - 34.0  pg   MCHC 34.2  30.0 - 36.0 g/dL   RDW 40.9  81.1 - 91.4 %   Platelets 251  150 - 400 K/uL   Neutrophils Relative % 88 (*) 43 - 77 %   Neutro Abs 13.3 (*) 1.7 - 7.7 K/uL   Lymphocytes Relative 6 (*) 12 - 46 %   Lymphs Abs 0.9  0.7 - 4.0 K/uL   Monocytes Relative 6  3 - 12 %   Monocytes Absolute 0.9  0.1 - 1.0 K/uL   Eosinophils Relative 0  0 - 5 %   Eosinophils Absolute 0.0  0.0 - 0.7 K/uL   Basophils Relative 0  0 - 1 %   Basophils Absolute 0.0  0.0 - 0.1 K/uL  TROPONIN I      Result Value Ref Range   Troponin I <0.30  <0.30 ng/mL  OCCULT BLOOD GASTRIC / DUODENUM (SPECIMEN CUP)      Result Value Ref Range   pH, Gastric PENDING     Occult Blood, Gastric POSITIVE (*) NEGATIVE  POC OCCULT BLOOD, ED      Result Value Ref Range   Fecal Occult Bld POSITIVE (*) NEGATIVE     Labs Review Labs Reviewed  COMPREHENSIVE METABOLIC PANEL - Abnormal; Notable for the following:    Glucose, Bld 166 (*)    Calcium 11.0 (*)    Total Protein 8.5 (*)    Albumin 5.4 (*)    Anion gap 21 (*)    All other components within normal limits  URINALYSIS, ROUTINE W REFLEX MICROSCOPIC - Abnormal; Notable for the following:    APPearance CLOUDY (*)    All other components within normal limits  CBC WITH DIFFERENTIAL - Abnormal; Notable for the following:    WBC 15.1 (*)    Neutrophils Relative % 88 (*)    Neutro Abs 13.3 (*)    Lymphocytes Relative 6 (*)    All other components within normal limits  OCCULT BLOOD GASTRIC / DUODENUM (SPECIMEN CUP) - Abnormal; Notable for the following:    Occult Blood, Gastric POSITIVE (*)    All other components within normal limits  POC OCCULT BLOOD, ED - Abnormal; Notable for the following:    Fecal Occult Bld POSITIVE (*)    All other components within normal limits  LIPASE, BLOOD  TROPONIN I  CBC WITH DIFFERENTIAL  POCT GASTRIC OCCULT BLOOD (1-CARD TO LAB)    Imaging Review Ct Abdomen Pelvis W Contrast  10/06/2013   CLINICAL DATA:  Elevated white blood  cell count; nausea, vomiting, and diarrhea  EXAM: CT ABDOMEN AND PELVIS WITH CONTRAST  TECHNIQUE: Multidetector CT imaging of the abdomen and pelvis was performed using the standard protocol following bolus administration of intravenous contrast. Oral contrast was also administered.  CONTRAST:  100mL OMNIPAQUE IOHEXOL 300 MG/ML  SOLN  COMPARISON:  July 04, 2013  FINDINGS: Lung bases are clear.  There is a small hiatal hernia.  There is fatty change in the liver. No focal liver lesions are identified. There is no appreciable biliary duct dilatation. There is evidence suggesting a degree of pericholecystic fluid. Gallbladder wall does not appear thickened, and no gallstones are seen by CT.  Spleen, pancreas, and adrenals appear normal. Kidneys bilaterally show no mass or hydronephrosis on either side. There is no renal or ureteral calculus apparent on either side.  In the pelvis, the urinary bladder is midline with normal wall thickness. There is no pelvic mass or fluid collection.  The colon is largely decompressed. No frank colonic wall thickening is appreciated on this study. Appendix appears normal. The terminal ileum appears within normal limits.  There is no apparent bowel obstruction. No free air or portal venous air.  There is no ascites, adenopathy, or abscess in the abdomen or pelvis. There is no abdominal aortic aneurysm apparent. There are no blastic or lytic bone lesions.  IMPRESSION: Findings concerning for pericholecystic fluid. Advise correlation with ultrasound of the gallbladder to further evaluate.  Small hiatal hernia.  No bowel obstruction. No abscess. Appendix appears normal. No renal or ureteral calculus. No hydronephrosis. The colon is largely decompressed. Colon wall is not felt to be thickened when allowing for the degree of colonic decompression.   Electronically Signed   By: Bretta BangWilliam  Woodruff M.D.   On: 10/06/2013 00:58   Dg Abd Acute W/chest  10/05/2013   CLINICAL DATA:  nausea and  vomiting  EXAM: ACUTE ABDOMEN SERIES (ABDOMEN 2 VIEW & CHEST 1 VIEW)  COMPARISON:  Prior CT from 07/04/2013  FINDINGS: The cardiac and mediastinal silhouettes are stable in size and contour, and remain within normal limits.  The lungs are normally inflated. No airspace consolidation, pleural effusion, or pulmonary edema is identified. There is no pneumothorax.  Bowel gas pattern is unremarkable without evidence of obstruction or ileus. No abnormal bowel wall thickening. No free intraperitoneal air. No soft tissue mass or abnormal calcifications.  No acute osseus abnormality.  IMPRESSION: 1. Nonobstructive bowel gas pattern with no radiographic evidence of acute intra-abdominal process. 2. No acute cardiopulmonary abnormality.   Electronically Signed   By: Rise MuBenjamin  McClintock M.D.   On: 10/05/2013 20:46     EKG Interpretation   Date/Time:  Thursday October 05 2013 23:13:08 EDT Ventricular Rate:  40 PR Interval:  124 QRS Duration: 111 QT Interval:  462 QTC Calculation: 377 R Axis:   98 Text Interpretation:  Sinus bradycardia Consider right ventricular  hypertrophy Similar to prior Confirmed by Gwendolyn GrantWALDEN  MD, BLAIR ([id number]) on  10/05/2013 11:16:46 PM      MDM   Final diagnoses:  None    Medications  ondansetron (ZOFRAN-ODT) disintegrating tablet 8 mg (8 mg Oral Given 10/05/13 1930)  sodium chloride 0.9 % bolus 1,000 mL (0 mLs Intravenous Stopped 10/05/13 2245)  ondansetron (ZOFRAN) injection 4 mg (4 mg  Intravenous Given 10/05/13 2100)  morphine 4 MG/ML injection 4 mg (4 mg Intravenous Given 10/05/13 2145)  promethazine (PHENERGAN) injection 12.5 mg (12.5 mg Intravenous Given 10/05/13 2241)  sodium chloride 0.9 % bolus 1,000 mL (0 mLs Intravenous Stopped 10/06/13 0012)  iohexol (OMNIPAQUE) 300 MG/ML solution 50 mL (50 mLs Oral Contrast Given 10/06/13 0008)  iohexol (OMNIPAQUE) 300 MG/ML solution 100 mL (100 mLs Intravenous Contrast Given 10/06/13 0039)   Filed Vitals:   10/05/13 2212 10/05/13 2240  10/05/13 2244 10/05/13 2314  BP: 107/56 111/58 111/58 123/73  Pulse: 40 76 39   Temp:      TempSrc:      Resp:   16 17  SpO2: 100% 100% 100%    This provider reviewed the patient's chart. Patient has been seen and assessed in the ED regarding similar complaints. Patient was last seen in April of 2015 with negative labs  And CT abdomen and pelvis with contrast.  EKG sinus bradycardia with heart rate of 40 bpm. Troponin negative elevation. CBC noted elevated white blood cell count of 15.1 with elevated neutrophils of 13.3. CMP noted mildly elevated calcium 11.0. Glucose of 166, anion gap 21 milliequivalents per liter. Lipase negative elevation. Urinalysis negative for hemoglobin, nitrites, leukocytes. Gastri occult positive. Acute abdomen with chest pain film nonobjective bowel gas pattern with no radiographic evidence of acute intra-abdominal processes-no acute cardiopulmonary abnormalities. CT abdomen pelvis with contrast noted findings concerning for per close to fluid-recommend right upper quadrant ultrasound of the gallbladder. Small hiatal hernia noted. No bowel obstruction. No abscess. Appendix appears normal. No renal ureteral calculus. No hydronephrosis. The colon is largely decompressed not felt to be thickened when allowing for degree of colonic decompression.  While in the ED setting patient became bradycardic with a heart rate in the 30's - approximately 33 bpm. Patient was not symptomatic. EKG captured that noted sinus bradycardia with negative prolonged QT interval or PR prolongation. Patient's blood pressure stable and pulse ox 100% on room air. Attending was at bedside assessing patient - Dr. Raenette Rover  Suspicion to be cannabis induced cyclic vomiting syndrome. RUQ ordered secondary to recommendation of CT for possible rule out acute cholecystitis, pending results. Patient to be admitted to hospital for intractable nausea and vomiting with incidental findings of asymptomatic bradycardia  while in the ED setting. Discussed case and plan with Roxy Horseman, PA-C. Transfer of care to Roxy Horseman, PA-C at change in shift.   Raymon Mutton, PA-C 10/06/13 1451

## 2013-10-06 ENCOUNTER — Observation Stay (HOSPITAL_COMMUNITY): Payer: Managed Care, Other (non HMO)

## 2013-10-06 ENCOUNTER — Emergency Department (HOSPITAL_COMMUNITY): Payer: Managed Care, Other (non HMO)

## 2013-10-06 ENCOUNTER — Encounter (HOSPITAL_COMMUNITY): Payer: Self-pay

## 2013-10-06 DIAGNOSIS — R52 Pain, unspecified: Secondary | ICD-10-CM

## 2013-10-06 DIAGNOSIS — R1013 Epigastric pain: Secondary | ICD-10-CM

## 2013-10-06 DIAGNOSIS — R1012 Left upper quadrant pain: Secondary | ICD-10-CM | POA: Diagnosis present

## 2013-10-06 DIAGNOSIS — D72829 Elevated white blood cell count, unspecified: Secondary | ICD-10-CM

## 2013-10-06 DIAGNOSIS — R109 Unspecified abdominal pain: Secondary | ICD-10-CM

## 2013-10-06 DIAGNOSIS — R112 Nausea with vomiting, unspecified: Secondary | ICD-10-CM

## 2013-10-06 DIAGNOSIS — R1115 Cyclical vomiting syndrome unrelated to migraine: Secondary | ICD-10-CM

## 2013-10-06 LAB — COMPREHENSIVE METABOLIC PANEL
ALT: 12 U/L (ref 0–53)
ANION GAP: 13 (ref 5–15)
AST: 15 U/L (ref 0–37)
Albumin: 4.2 g/dL (ref 3.5–5.2)
Alkaline Phosphatase: 51 U/L (ref 39–117)
BILIRUBIN TOTAL: 0.7 mg/dL (ref 0.3–1.2)
BUN: 9 mg/dL (ref 6–23)
CHLORIDE: 104 meq/L (ref 96–112)
CO2: 25 mEq/L (ref 19–32)
CREATININE: 0.76 mg/dL (ref 0.50–1.35)
Calcium: 9.5 mg/dL (ref 8.4–10.5)
GFR calc Af Amer: >90 mL/min (ref >90)
GFR calc non Af Amer: >90 mL/min (ref >90)
Glucose, Bld: 125 mg/dL — ABNORMAL HIGH (ref 70–99)
Potassium: 4.6 mEq/L (ref 3.7–5.3)
Sodium: 142 mEq/L (ref 137–147)
Total Protein: 6.9 g/dL (ref 6.0–8.3)

## 2013-10-06 LAB — RAPID URINE DRUG SCREEN, HOSP PERFORMED
Amphetamines: NOT DETECTED
Barbiturates: NOT DETECTED
Benzodiazepines: NOT DETECTED
Cocaine: NOT DETECTED
Opiates: NOT DETECTED
TETRAHYDROCANNABINOL: POSITIVE — AB

## 2013-10-06 LAB — CBC
HEMATOCRIT: 40.5 % (ref 39.0–52.0)
Hemoglobin: 13.7 g/dL (ref 13.0–17.0)
MCH: 30.5 pg (ref 26.0–34.0)
MCHC: 33.8 g/dL (ref 30.0–36.0)
MCV: 90.2 fL (ref 78.0–100.0)
Platelets: 235 10*3/uL (ref 150–400)
RBC: 4.49 MIL/uL (ref 4.22–5.81)
RDW: 13.1 % (ref 11.5–15.5)
WBC: 8 10*3/uL (ref 4.0–10.5)

## 2013-10-06 LAB — OCCULT BLOOD GASTRIC / DUODENUM (SPECIMEN CUP)
Occult Blood, Gastric: POSITIVE — AB
PH, GASTRIC: 3

## 2013-10-06 MED ORDER — DICYCLOMINE HCL 10 MG PO CAPS
10.0000 mg | ORAL_CAPSULE | Freq: Four times a day (QID) | ORAL | Status: DC | PRN
  Filled 2013-10-06: qty 1

## 2013-10-06 MED ORDER — ONDANSETRON HCL 4 MG/2ML IJ SOLN: 4.0000 mg | Freq: Once | INTRAMUSCULAR | Status: DC

## 2013-10-06 MED ORDER — MORPHINE SULFATE 4 MG/ML IJ SOLN
4.0000 mg | Freq: Once | INTRAMUSCULAR | Status: AC
  Administered 2013-10-06: 4 mg via INTRAVENOUS
  Filled 2013-10-06: qty 1

## 2013-10-06 MED ORDER — PANTOPRAZOLE SODIUM 40 MG PO TBEC: 40.0000 mg | DELAYED_RELEASE_TABLET | Freq: Every day | ORAL | Status: DC

## 2013-10-06 MED ORDER — IOHEXOL 300 MG/ML  SOLN
50.0000 mL | Freq: Once | INTRAMUSCULAR | Status: AC | PRN
Start: 2013-10-06 — End: 2013-10-06
  Administered 2013-10-06: 50 mL via ORAL

## 2013-10-06 MED ORDER — SODIUM CHLORIDE 0.9 % IV SOLN
INTRAVENOUS | Status: AC
  Administered 2013-10-06: 04:00:00 via INTRAVENOUS

## 2013-10-06 MED ORDER — MORPHINE SULFATE 2 MG/ML IJ SOLN: 1.0000 mg | Freq: Four times a day (QID) | INTRAMUSCULAR | Status: DC | PRN

## 2013-10-06 MED ORDER — LORAZEPAM 2 MG/ML IJ SOLN
1.0000 mg | Freq: Once | INTRAMUSCULAR | Status: AC
  Administered 2013-10-06: 1 mg via INTRAVENOUS
  Filled 2013-10-06: qty 1

## 2013-10-06 MED ORDER — SODIUM CHLORIDE 0.9 % IV BOLUS (SEPSIS)
1000.0000 mL | Freq: Once | INTRAVENOUS | Status: AC
  Administered 2013-10-06: 1000 mL via INTRAVENOUS

## 2013-10-06 MED ORDER — LAMOTRIGINE 100 MG PO TABS
100.0000 mg | ORAL_TABLET | Freq: Every day | ORAL | Status: DC
  Filled 2013-10-06 (×2): qty 1

## 2013-10-06 MED ORDER — ONDANSETRON HCL 4 MG PO TABS: 4.0000 mg | ORAL_TABLET | Freq: Four times a day (QID) | ORAL | Status: DC | PRN

## 2013-10-06 MED ORDER — TECHNETIUM TC 99M MEBROFENIN IV KIT
5.3000 | PACK | Freq: Once | INTRAVENOUS | Status: AC | PRN
  Administered 2013-10-06: 5.3 via INTRAVENOUS

## 2013-10-06 MED ORDER — PANTOPRAZOLE SODIUM 40 MG PO TBEC
40.0000 mg | DELAYED_RELEASE_TABLET | Freq: Every day | ORAL | Status: DC
  Administered 2013-10-06: 40 mg via ORAL
  Filled 2013-10-06 (×2): qty 1

## 2013-10-06 MED ORDER — OXYCODONE HCL 5 MG PO TABS
5.0000 mg | ORAL_TABLET | Freq: Four times a day (QID) | ORAL | Status: DC | PRN
  Administered 2013-10-06: 5 mg via ORAL
  Filled 2013-10-06: qty 1

## 2013-10-06 MED ORDER — KETOROLAC TROMETHAMINE 15 MG/ML IJ SOLN
30.0000 mg | Freq: Once | INTRAMUSCULAR | Status: AC
  Administered 2013-10-06: 30 mg via INTRAVENOUS
  Filled 2013-10-06: qty 2

## 2013-10-06 MED ORDER — ONDANSETRON HCL 4 MG/2ML IJ SOLN
4.0000 mg | Freq: Four times a day (QID) | INTRAMUSCULAR | Status: DC | PRN
  Administered 2013-10-06 (×2): 4 mg via INTRAVENOUS
  Filled 2013-10-06 (×2): qty 2

## 2013-10-06 MED ORDER — IOHEXOL 300 MG/ML  SOLN
100.0000 mL | Freq: Once | INTRAMUSCULAR | Status: AC | PRN
  Administered 2013-10-06: 100 mL via INTRAVENOUS

## 2013-10-06 MED ORDER — ACETAMINOPHEN 325 MG PO TABS: 650.0000 mg | ORAL_TABLET | Freq: Four times a day (QID) | ORAL | Status: DC | PRN

## 2013-10-06 NOTE — H&P (Signed)
Chief Complaint:  abd pain, n,v  HPI: 28 yo healthy male with one day of persistent n/v and epigastric/luq abdominal pain .  No diarrhea.  No fevers.  No blood in vomit.  His abd pain is better than it was earlier tonight.  Ultrasound shows possible cholecystitis.  Review of Systems:  Positive and negative as per HPI otherwise all other systems are negative  Past Medical History: Past Medical History  Diagnosis Date  . Bipolar 1 disorder   . GI bleed   . H/O endoscopy   . Ulcer    History reviewed. No pertinent past surgical history.  Medications: Prior to Admission medications   Medication Sig Start Date End Date Taking? Authorizing Provider  lamoTRIgine (LAMICTAL) 100 MG tablet Take 100 mg by mouth daily.   Yes Historical Provider, MD    Allergies:  No Known Allergies  Social History:  reports that he has been smoking Cigarettes.  He has been smoking about 1.00 pack per day. He has never used smokeless tobacco. He reports that he drinks about 3 ounces of alcohol per week. He reports that he uses illicit drugs (Marijuana).  Family History: No family history on file.  Physical Exam: Filed Vitals:   10/05/13 2212 10/05/13 2240 10/05/13 2244 10/05/13 2314  BP: 107/56 111/58 111/58 123/73  Pulse: 40 76 39   Temp:      TempSrc:      Resp:   16 17  SpO2: 100% 100% 100%    General appearance: alert, cooperative and no distress Head: Normocephalic, without obvious abnormality, atraumatic Eyes: negative Nose: Nares normal. Septum midline. Mucosa normal. No drainage or sinus tenderness. Neck: no JVD and supple, symmetrical, trachea midline Lungs: clear to auscultation bilaterally Heart: regular rate and rhythm, S1, S2 normal, no murmur, click, rub or gallop Abdomen: soft, non-tender; bowel sounds normal; no masses,  no organomegaly Extremities: extremities normal, atraumatic, no cyanosis or edema Pulses: 2+ and symmetric Skin: Skin color, texture, turgor normal. No  rashes or lesions Neurologic: Grossly normal   Labs on Admission:   Recent Labs  10/05/13 2036  NA 141  K 3.8  CL 99  CO2 21  GLUCOSE 166*  BUN 10  CREATININE 0.80  CALCIUM 11.0*    Recent Labs  10/05/13 2036  AST 23  ALT 15  ALKPHOS 62  BILITOT 0.9  PROT 8.5*  ALBUMIN 5.4*    Recent Labs  10/05/13 2036  LIPASE 28    Recent Labs  10/05/13 2105  WBC 15.1*  NEUTROABS 13.3*  HGB 15.3  HCT 44.7  MCV 89.6  PLT 251    Recent Labs  10/05/13 2205  TROPONINI <0.30   Radiological Exams on Admission: Ct Abdomen Pelvis W Contrast  10/06/2013   CLINICAL DATA:  Elevated white blood cell count; nausea, vomiting, and diarrhea  EXAM: CT ABDOMEN AND PELVIS WITH CONTRAST  TECHNIQUE: Multidetector CT imaging of the abdomen and pelvis was performed using the standard protocol following bolus administration of intravenous contrast. Oral contrast was also administered.  CONTRAST:  100mL OMNIPAQUE IOHEXOL 300 MG/ML  SOLN  COMPARISON:  July 04, 2013  FINDINGS: Lung bases are clear.  There is a small hiatal hernia.  There is fatty change in the liver. No focal liver lesions are identified. There is no appreciable biliary duct dilatation. There is evidence suggesting a degree of pericholecystic fluid. Gallbladder wall does not appear thickened, and no gallstones are seen by CT.  Spleen, pancreas, and adrenals appear normal. Kidneys  bilaterally show no mass or hydronephrosis on either side. There is no renal or ureteral calculus apparent on either side.  In the pelvis, the urinary bladder is midline with normal wall thickness. There is no pelvic mass or fluid collection.  The colon is largely decompressed. No frank colonic wall thickening is appreciated on this study. Appendix appears normal. The terminal ileum appears within normal limits.  There is no apparent bowel obstruction. No free air or portal venous air.  There is no ascites, adenopathy, or abscess in the abdomen or pelvis. There  is no abdominal aortic aneurysm apparent. There are no blastic or lytic bone lesions.  IMPRESSION: Findings concerning for pericholecystic fluid. Advise correlation with ultrasound of the gallbladder to further evaluate.  Small hiatal hernia.  No bowel obstruction. No abscess. Appendix appears normal. No renal or ureteral calculus. No hydronephrosis. The colon is largely decompressed. Colon wall is not felt to be thickened when allowing for the degree of colonic decompression.   Electronically Signed   By: Bretta Bang M.D.   On: 10/06/2013 00:58   Dg Abd Acute W/chest  10/05/2013   CLINICAL DATA:  nausea and vomiting  EXAM: ACUTE ABDOMEN SERIES (ABDOMEN 2 VIEW & CHEST 1 VIEW)  COMPARISON:  Prior CT from 07/04/2013  FINDINGS: The cardiac and mediastinal silhouettes are stable in size and contour, and remain within normal limits.  The lungs are normally inflated. No airspace consolidation, pleural effusion, or pulmonary edema is identified. There is no pneumothorax.  Bowel gas pattern is unremarkable without evidence of obstruction or ileus. No abnormal bowel wall thickening. No free intraperitoneal air. No soft tissue mass or abnormal calcifications.  No acute osseus abnormality.  IMPRESSION: 1. Nonobstructive bowel gas pattern with no radiographic evidence of acute intra-abdominal process. 2. No acute cardiopulmonary abnormality.   Electronically Signed   By: Rise Mu M.D.   On: 10/05/2013 20:46   US Abdomen Limited Ruq  10/06/2013   CLINICAL DATA:  Followup CT abnormality.  EXAM: US ABDOMEN LIMITED - RIGHT UPPER QUADRANT  COMPARISON:  CT of the abdomen and pelvis October 06, 2013 at 0048 hr  FINDINGS: Gallbladder:  Gallbladder wall thickening to 7 mm with trace pericholecystic fluid. No sonographic findings of cholelithiasis. Limited assessment for Murphy's sign as patient is on pain medication.  Common bile duct:  Diameter: 4 mm  Liver:  No focal lesion identified. Within normal limits in  parenchymal echogenicity. Hepatopetal portal vein.  IMPRESSION: Gallbladder wall thickening with trace pericholecystic fluid, could reflect acalculus cholecystitis, limited assessment for sonographic Murphy sign as patient is on pain medication. Findings of likely reflect sequelae of portal hypertension.   Electronically Signed   By: Awilda Metro   On: 10/06/2013 02:12    Assessment/Plan  28 yo male with abd pain, n/v and possible cholecystitis  Principal Problem:   Abdominal pain, acute, left upper quadrant-  obs for hida scan in am and general surgery consult  Active Problems:   Nausea and vomiting  obs on med.  Full code.  [place] Lesure A 10/06/2013, 3:23 AM

## 2013-10-06 NOTE — Progress Notes (Signed)
Utilization review completed.  

## 2013-10-06 NOTE — Discharge Summary (Signed)
Physician Discharge Summary  Preston Brewer ZOX:[id number] DOB: December 06, 1985 DOA: 10/05/2013  PCP: No PCP Per Patient  Admit date: 10/05/2013 Discharge date: 10/06/2013  Time spent: Less than 30 minutes  Recommendations for Outpatient Follow-up:  1. PCP in 1 week  Discharge Diagnoses:  Principal Problem:   Abdominal pain, acute, left upper quadrant Active Problems:   Nausea and vomiting   Discharge Condition: Improved & Stable  Diet recommendation: Regular diet  Filed Weights   10/06/13 0403  Weight: 81.647 kg (180 lb)    History of present illness:  28 year old male patient with history of bipolar disorder, admitted on 10/06/13 with complaints of intractable emesis and abdominal pain. He works at [employer] and apparently consumed some stale food on Wednesday and symptoms started the next day. His had several episodes of emesis which initially were food that he had consumed but subsequently light brown in color without frank blood. He had associated mid abdominal pain. He denies diarrhea. He had chills without fevers. Denies contact with others with similar complaints. He has prior history of recurrent nausea and vomiting of undetermined etiology. He has history of substance abuse. Since admission, his symptoms have improved.    Hospital Course:   1. Nausea, vomiting and abdominal pain: DD-acute gastroenteritis, food poisoning, cyclical vomiting syndrome, other etiologies. CT and ultrasound abdomen had shown thickening of gallbladder with pericholecystic fluid but HIDA scan is negative. Surgical input appreciated-does not have a surgical abdomen. Patient was treated conservatively with bowel rest, IV fluids, antiemetics, pain medications, and PPI. He has improved with no further episodes of nausea or vomiting. Abdominal pain has subsided. He has tolerated full liquid diet. He wishes to try a regular diet now and if he tolerates it, he wants to go home. Counseled regarding abstinence from  substance abuse. Continue PPI at DC. Advised that he may need further evaluation i.e. GI consultation as outpatient if he has recurrent symptoms. 2. Leukocytosis: Likely stress response. Resolved. 3. History of substance abuse:-Tobacco, marijuana. Denies recent alcohol consumption. Cessation counseled.  4. History of bipolar disorder: Outpatient followup. Stable   Consultations:  General surgery  Procedures:  None    Discharge Exam:  Complaints:  Nausea and vomiting have resolved. Tolerating diet. Abdominal pain has significantly improved.  Filed Vitals:   10/05/13 2244 10/05/13 2314 10/06/13 0403 10/06/13 1440  BP: 111/58 123/73 119/60 110/62  Pulse: 39  51 49  Temp:   98.9 F (37.2 C) 98.8 F (37.1 C)  TempSrc:   Oral   Resp: 16 17 16 16   Height:   6\' 1"  (1.854 m)   Weight:   81.647 kg (180 lb)   SpO2: 100%  100% 100%    General exam: pleasant young male lying comfortably in bed.  Respiratory system: Clear. No increased work of breathing.  Cardiovascular system: S1 & S2 heard, RRR. No JVD, murmurs, gallops, clicks or pedal edema.  Gastrointestinal system: Abdomen is nondistended, soft. Mild epigastric/periumbilical region tenderness without peritoneal signs. Negative Murphy sign . Normal bowel sounds heard.  Central nervous system: Alert and oriented. No focal neurological deficits.  Extremities: Symmetric 5 x 5 power.   Discharge Instructions      Discharge Instructions   Activity as tolerated - No restrictions    Complete by:  As directed      Diet general    Complete by:  As directed             Medication List  lamoTRIgine 100 MG tablet  Commonly known as:  LAMICTAL  Take 100 mg by mouth daily.     pantoprazole 40 MG tablet  Commonly known as:  PROTONIX  Take 1 tablet (40 mg total) by mouth daily.       Follow-up Information   Follow up with Primary MD of choice. Schedule an appointment as soon as possible for a visit in 1 week.        The results of significant diagnostics from this hospitalization (including imaging, microbiology, ancillary and laboratory) are listed below for reference.    Significant Diagnostic Studies: Nm Hepatobiliary Liver Func  10/06/2013   CLINICAL DATA:  Left upper quadrant pain. Nausea and vomiting. Possible cholecystitis.  EXAM: NUCLEAR MEDICINE HEPATOBILIARY IMAGING  TECHNIQUE: Sequential images of the abdomen were obtained out to 60 minutes following intravenous administration of radiopharmaceutical.  RADIOPHARMACEUTICALS:  5.3 Millicurie Tc-1269m Choletec  COMPARISON:  CT abdomen pelvis 10/06/2013  FINDINGS: There is homogeneous uptake throughout the liver parenchyma. Gallbladder filling begins at 10 min and continues throughout study. Common bile duct activity is seen by 10 min. Duodenal activity is seen at 30-35 min.  IMPRESSION: Normal hepatobiliary imaging.  No evidence of cholecystitis.   Electronically Signed   By: Britta MccreedySusan  Turner M.D.   On: 10/06/2013 11:46   Ct Abdomen Pelvis W Contrast  10/06/2013   CLINICAL DATA:  Elevated white blood cell count; nausea, vomiting, and diarrhea  EXAM: CT ABDOMEN AND PELVIS WITH CONTRAST  TECHNIQUE: Multidetector CT imaging of the abdomen and pelvis was performed using the standard protocol following bolus administration of intravenous contrast. Oral contrast was also administered.  CONTRAST:  100mL OMNIPAQUE IOHEXOL 300 MG/ML  SOLN  COMPARISON:  July 04, 2013  FINDINGS: Lung bases are clear.  There is a small hiatal hernia.  There is fatty change in the liver. No focal liver lesions are identified. There is no appreciable biliary duct dilatation. There is evidence suggesting a degree of pericholecystic fluid. Gallbladder wall does not appear thickened, and no gallstones are seen by CT.  Spleen, pancreas, and adrenals appear normal. Kidneys bilaterally show no mass or hydronephrosis on either side. There is no renal or ureteral calculus apparent on either side.   In the pelvis, the urinary bladder is midline with normal wall thickness. There is no pelvic mass or fluid collection.  The colon is largely decompressed. No frank colonic wall thickening is appreciated on this study. Appendix appears normal. The terminal ileum appears within normal limits.  There is no apparent bowel obstruction. No free air or portal venous air.  There is no ascites, adenopathy, or abscess in the abdomen or pelvis. There is no abdominal aortic aneurysm apparent. There are no blastic or lytic bone lesions.  IMPRESSION: Findings concerning for pericholecystic fluid. Advise correlation with ultrasound of the gallbladder to further evaluate.  Small hiatal hernia.  No bowel obstruction. No abscess. Appendix appears normal. No renal or ureteral calculus. No hydronephrosis. The colon is largely decompressed. Colon wall is not felt to be thickened when allowing for the degree of colonic decompression.   Electronically Signed   By: Bretta BangWilliam  Woodruff M.D.   On: 10/06/2013 00:58   Dg Abd Acute W/chest  10/05/2013   CLINICAL DATA:  nausea and vomiting  EXAM: ACUTE ABDOMEN SERIES (ABDOMEN 2 VIEW & CHEST 1 VIEW)  COMPARISON:  Prior CT from 07/04/2013  FINDINGS: The cardiac and mediastinal silhouettes are stable in size and contour, and remain within normal limits.  The  lungs are normally inflated. No airspace consolidation, pleural effusion, or pulmonary edema is identified. There is no pneumothorax.  Bowel gas pattern is unremarkable without evidence of obstruction or ileus. No abnormal bowel wall thickening. No free intraperitoneal air. No soft tissue mass or abnormal calcifications.  No acute osseus abnormality.  IMPRESSION: 1. Nonobstructive bowel gas pattern with no radiographic evidence of acute intra-abdominal process. 2. No acute cardiopulmonary abnormality.   Electronically Signed   By: Rise Mu M.D.   On: 10/05/2013 20:46   US Abdomen Limited Ruq  10/06/2013   CLINICAL DATA:   Followup CT abnormality.  EXAM: US ABDOMEN LIMITED - RIGHT UPPER QUADRANT  COMPARISON:  CT of the abdomen and pelvis October 06, 2013 at 0048 hr  FINDINGS: Gallbladder:  Gallbladder wall thickening to 7 mm with trace pericholecystic fluid. No sonographic findings of cholelithiasis. Limited assessment for Murphy's sign as patient is on pain medication.  Common bile duct:  Diameter: 4 mm  Liver:  No focal lesion identified. Within normal limits in parenchymal echogenicity. Hepatopetal portal vein.  IMPRESSION: Gallbladder wall thickening with trace pericholecystic fluid, could reflect acalculus cholecystitis, limited assessment for sonographic Murphy sign as patient is on pain medication. Findings of likely reflect sequelae of portal hypertension.   Electronically Signed   By: Awilda Metro   On: 10/06/2013 02:12    Microbiology: No results found for this or any previous visit (from the past 240 hour(s)).   Labs: Basic Metabolic Panel:  Recent Labs Lab 10/05/13 2036 10/06/13 0522  NA 141 142  K 3.8 4.6  CL 99 104  CO2 21 25  GLUCOSE 166* 125*  BUN 10 9  CREATININE 0.80 0.76  CALCIUM 11.0* 9.5   Liver Function Tests:  Recent Labs Lab 10/05/13 2036 10/06/13 0522  AST 23 15  ALT 15 12  ALKPHOS 62 51  BILITOT 0.9 0.7  PROT 8.5* 6.9  ALBUMIN 5.4* 4.2    Recent Labs Lab 10/05/13 2036  LIPASE 28   No results found for this basename: AMMONIA,  in the last 168 hours CBC:  Recent Labs Lab 10/05/13 2105 10/06/13 0522  WBC 15.1* 8.0  NEUTROABS 13.3*  --   HGB 15.3 13.7  HCT 44.7 40.5  MCV 89.6 90.2  PLT 251 235   Cardiac Enzymes:  Recent Labs Lab 10/05/13 2205  TROPONINI <0.30   BNP: BNP (last 3 results) No results found for this basename: PROBNP,  in the last 8760 hours CBG: No results found for this basename: GLUCAP,  in the last 168 hours    Signed:  Marcellus Scott, MD, FACP, FHM. [facility] Pager [phone]  If 7PM-7AM, please contact  night-coverage [email or web address] Password [facility] 10/06/2013, 5:40 PM

## 2013-10-06 NOTE — ED Provider Notes (Signed)
2:53 AM Patient signed out to me by Robyne Askew.   2:53 AM Discussed patient with Dr. Ezzard Standing from general surgery, who recommends HIDA scan and medical admission. He'll see the patient in the morning.  3:06 AM Discussed the patient with Dr. Onalee Hua, who will admit.  Results for orders placed during the hospital encounter of 10/05/13  COMPREHENSIVE METABOLIC PANEL      Result Value Ref Range   Sodium 141  137 - 147 mEq/L   Potassium 3.8  3.7 - 5.3 mEq/L   Chloride 99  96 - 112 mEq/L   CO2 21  19 - 32 mEq/L   Glucose, Bld 166 (*) 70 - 99 mg/dL   BUN 10  6 - 23 mg/dL   Creatinine, Ser 1.61  0.50 - 1.35 mg/dL   Calcium 09.6 (*) 8.4 - 10.5 mg/dL   Total Protein 8.5 (*) 6.0 - 8.3 g/dL   Albumin 5.4 (*) 3.5 - 5.2 g/dL   AST 23  0 - 37 U/L   ALT 15  0 - 53 U/L   Alkaline Phosphatase 62  39 - 117 U/L   Total Bilirubin 0.9  0.3 - 1.2 mg/dL   GFR calc non Af Amer >90  >90 mL/min   GFR calc Af Amer >90  >90 mL/min   Anion gap 21 (*) 5 - 15  LIPASE, BLOOD      Result Value Ref Range   Lipase 28  11 - 59 U/L  URINALYSIS, ROUTINE W REFLEX MICROSCOPIC      Result Value Ref Range   Color, Urine YELLOW  YELLOW   APPearance CLOUDY (*) CLEAR   Specific Gravity, Urine 1.011  1.005 - 1.030   pH 7.5  5.0 - 8.0   Glucose, UA NEGATIVE  NEGATIVE mg/dL   Hgb urine dipstick NEGATIVE  NEGATIVE   Bilirubin Urine NEGATIVE  NEGATIVE   Ketones, ur NEGATIVE  NEGATIVE mg/dL   Protein, ur NEGATIVE  NEGATIVE mg/dL   Urobilinogen, UA 0.2  0.0 - 1.0 mg/dL   Nitrite NEGATIVE  NEGATIVE   Leukocytes, UA NEGATIVE  NEGATIVE  CBC WITH DIFFERENTIAL      Result Value Ref Range   WBC 15.1 (*) 4.0 - 10.5 K/uL   RBC 4.99  4.22 - 5.81 MIL/uL   Hemoglobin 15.3  13.0 - 17.0 g/dL   HCT 04.5  40.9 - 81.1 %   MCV 89.6  78.0 - 100.0 fL   MCH 30.7  26.0 - 34.0 pg   MCHC 34.2  30.0 - 36.0 g/dL   RDW 91.4  78.2 - 95.6 %   Platelets 251  150 - 400 K/uL   Neutrophils Relative % 88 (*) 43 - 77 %   Neutro Abs 13.3 (*)  1.7 - 7.7 K/uL   Lymphocytes Relative 6 (*) 12 - 46 %   Lymphs Abs 0.9  0.7 - 4.0 K/uL   Monocytes Relative 6  3 - 12 %   Monocytes Absolute 0.9  0.1 - 1.0 K/uL   Eosinophils Relative 0  0 - 5 %   Eosinophils Absolute 0.0  0.0 - 0.7 K/uL   Basophils Relative 0  0 - 1 %   Basophils Absolute 0.0  0.0 - 0.1 K/uL  TROPONIN I      Result Value Ref Range   Troponin I <0.30  <0.30 ng/mL  OCCULT BLOOD GASTRIC / DUODENUM (SPECIMEN CUP)      Result Value Ref Range   pH, Gastric PENDING  Occult Blood, Gastric POSITIVE (*) NEGATIVE  POC OCCULT BLOOD, ED      Result Value Ref Range   Fecal Occult Bld POSITIVE (*) NEGATIVE   Ct Abdomen Pelvis W Contrast  10/06/2013   CLINICAL DATA:  Elevated white blood cell count; nausea, vomiting, and diarrhea  EXAM: CT ABDOMEN AND PELVIS WITH CONTRAST  TECHNIQUE: Multidetector CT imaging of the abdomen and pelvis was performed using the standard protocol following bolus administration of intravenous contrast. Oral contrast was also administered.  CONTRAST:  100mL OMNIPAQUE IOHEXOL 300 MG/ML  SOLN  COMPARISON:  July 04, 2013  FINDINGS: Lung bases are clear.  There is a small hiatal hernia.  There is fatty change in the liver. No focal liver lesions are identified. There is no appreciable biliary duct dilatation. There is evidence suggesting a degree of pericholecystic fluid. Gallbladder wall does not appear thickened, and no gallstones are seen by CT.  Spleen, pancreas, and adrenals appear normal. Kidneys bilaterally show no mass or hydronephrosis on either side. There is no renal or ureteral calculus apparent on either side.  In the pelvis, the urinary bladder is midline with normal wall thickness. There is no pelvic mass or fluid collection.  The colon is largely decompressed. No frank colonic wall thickening is appreciated on this study. Appendix appears normal. The terminal ileum appears within normal limits.  There is no apparent bowel obstruction. No free air or  portal venous air.  There is no ascites, adenopathy, or abscess in the abdomen or pelvis. There is no abdominal aortic aneurysm apparent. There are no blastic or lytic bone lesions.  IMPRESSION: Findings concerning for pericholecystic fluid. Advise correlation with ultrasound of the gallbladder to further evaluate.  Small hiatal hernia.  No bowel obstruction. No abscess. Appendix appears normal. No renal or ureteral calculus. No hydronephrosis. The colon is largely decompressed. Colon wall is not felt to be thickened when allowing for the degree of colonic decompression.   Electronically Signed   By: Bretta BangWilliam  Woodruff M.D.   On: 10/06/2013 00:58   Dg Abd Acute W/chest  10/05/2013   CLINICAL DATA:  nausea and vomiting  EXAM: ACUTE ABDOMEN SERIES (ABDOMEN 2 VIEW & CHEST 1 VIEW)  COMPARISON:  Prior CT from 07/04/2013  FINDINGS: The cardiac and mediastinal silhouettes are stable in size and contour, and remain within normal limits.  The lungs are normally inflated. No airspace consolidation, pleural effusion, or pulmonary edema is identified. There is no pneumothorax.  Bowel gas pattern is unremarkable without evidence of obstruction or ileus. No abnormal bowel wall thickening. No free intraperitoneal air. No soft tissue mass or abnormal calcifications.  No acute osseus abnormality.  IMPRESSION: 1. Nonobstructive bowel gas pattern with no radiographic evidence of acute intra-abdominal process. 2. No acute cardiopulmonary abnormality.   Electronically Signed   By: Rise MuBenjamin  McClintock M.D.   On: 10/05/2013 20:46   [place] Abdomen Limited Ruq  10/06/2013   CLINICAL DATA:  Followup CT abnormality.  EXAM: US ABDOMEN LIMITED - RIGHT UPPER QUADRANT  COMPARISON:  CT of the abdomen and pelvis October 06, 2013 at 0048 hr  FINDINGS: Gallbladder:  Gallbladder wall thickening to 7 mm with trace pericholecystic fluid. No sonographic findings of cholelithiasis. Limited assessment for Murphy's sign as patient is on pain medication.   Common bile duct:  Diameter: 4 mm  Liver:  No focal lesion identified. Within normal limits in parenchymal echogenicity. Hepatopetal portal vein.  IMPRESSION: Gallbladder wall thickening with trace pericholecystic fluid, could reflect acalculus cholecystitis,  limited assessment for sonographic Eulah Pont sign as patient is on pain medication. Findings of likely reflect sequelae of portal hypertension.   Electronically Signed   By: Awilda Metro   On: 10/06/2013 02:12      Roxy Horseman, PA-C 10/06/13 6045

## 2013-10-06 NOTE — Consult Note (Signed)
[facility] Surgery Consult Note  Preston Brewer January 13, 1986  [id number].    Requesting MD: Dr. Algis Liming Chief Complaint/Reason for Consult: Abdominal pain, N/V  HPI:  28 y/o male with PMHx of bipolar disorder presenting to the ED with nausea and emesis that started on 10/05/13 approximately 9:00AM.  Multiple episodes of emesis/retching with some blood tinges.  Stated that he has been unable to keep any food or fluids down. Stated he has been to the ER and admitted to the hospital on 3 other occasions in the last 2 years, but "was not given a diagnosis".  He has been seen for possible pancreatis, ulcers, and cyclical vomiting secondary to marijuana use.  No alleviating factors present.  He's tried OTC antacids without relief in the past.  Pt notes anxiety/stress, alcohol, smoking, marijuana, and diet as possible contributing factors to his vomiting episodes which seem to be cyclical.  He does not do any exercise.  Pain is in the upper abdomen and radiates to the central abdomen.  Denied fever, chills, chest pain, shortness of breath, difficulty breathing, melena, hematochezia, diarrhea/constipation.  He also notes he at some food from [employer] before the symptoms coming on that was not well refrigerated, but looked and tasted fine.    The patient works as a Freight forwarder at [employer] and frequently eats fried unhealthy food all day long.  He admits to drinking alcohol/caffeine excessively in the past as well as heavy marijuana use.  He says he's cut back on marijuana more recently, but he uses it to "help him eat when he otherwise wouldn't be able to eat".  He says he's had trouble with his stomach since childhood and even having episodes of cyclical vomiting at a young age.  He related that to stress as a child.  In the last few years he's had trouble with feeling excessively full and bloated after eating a meal.  He notes on occasion having to make himself vomit to relieve the fullness.  Vomiting usually  relieves the pain/discomfort for about 5 minutes and usually returns for a while.  He notes pain with defecation in the morning when he wakes up.  The abdominal discomfort comes on 15-30 minutes after meal.  He says he often will drink etoh/caffeine, smoke and use marijuana on binges.  He sees a psychiatrist who prescribes him Lamictal and xanax.  He does not have a PCP.  Thinks he had an EGD with biopsy which did not reveal a diagnosis.     ROS: All systems reviewed and otherwise negative except for as above  No family history on file.  Past Medical History  Diagnosis Date  . Bipolar 1 disorder   . GI bleed   . H/O endoscopy   . Ulcer     History reviewed. No pertinent past surgical history.  Social History:  reports that he has been smoking Cigarettes.  He has been smoking about 1.00 pack per day. He has never used smokeless tobacco. He reports that he drinks about 3 ounces of alcohol per week. He reports that he uses illicit drugs (Marijuana).  Allergies: No Known Allergies  Medications Prior to Admission  Medication Sig Dispense Refill  . lamoTRIgine (LAMICTAL) 100 MG tablet Take 100 mg by mouth daily.        Blood pressure 119/60, pulse 51, temperature 98.9 F (37.2 C), temperature source Oral, resp. rate 16, height '6\' 1"'  (1.854 m), weight 180 lb (81.647 kg), SpO2 100.00%. Physical Exam: General: pleasant, WD/WN white male who  is laying in bed in NAD HEENT: head is normocephalic, atraumatic.  Sclera are noninjected.  PERRL.  Ears and nose without any masses or lesions.  Mouth is pink and moist Heart: regular, rate, and rhythm.  No obvious murmurs, gallops, or rubs noted.  Palpable pedal pulses bilaterally Lungs: CTAB, no wheezes, rhonchi, or rales noted.  Respiratory effort nonlabored Abd: soft, ND, minimal tenderness in epigastrium, +BS, no masses, hernias, or organomegaly MS: all 4 extremities are symmetrical with no cyanosis, clubbing, or edema. Skin: warm and dry with no  masses, lesions, or rashes Psych: A&Ox3 with an appropriate affect, good insight   Results for orders placed during the hospital encounter of 10/05/13 (from the past 48 hour(s))  URINALYSIS, ROUTINE W REFLEX MICROSCOPIC     Status: Abnormal   Collection Time    10/05/13  7:55 PM      Result Value Ref Range   Color, Urine YELLOW  YELLOW   APPearance CLOUDY (*) CLEAR   Specific Gravity, Urine 1.011  1.005 - 1.030   pH 7.5  5.0 - 8.0   Glucose, UA NEGATIVE  NEGATIVE mg/dL   Hgb urine dipstick NEGATIVE  NEGATIVE   Bilirubin Urine NEGATIVE  NEGATIVE   Ketones, ur NEGATIVE  NEGATIVE mg/dL   Protein, ur NEGATIVE  NEGATIVE mg/dL   Urobilinogen, UA 0.2  0.0 - 1.0 mg/dL   Nitrite NEGATIVE  NEGATIVE   Leukocytes, UA NEGATIVE  NEGATIVE   Comment: MICROSCOPIC NOT DONE ON URINES WITH NEGATIVE PROTEIN, BLOOD, LEUKOCYTES, NITRITE, OR GLUCOSE <1000 mg/dL.  COMPREHENSIVE METABOLIC PANEL     Status: Abnormal   Collection Time    10/05/13  8:36 PM      Result Value Ref Range   Sodium 141  137 - 147 mEq/L   Comment: REPEATED TO VERIFY   Potassium 3.8  3.7 - 5.3 mEq/L   Comment: REPEATED TO VERIFY   Chloride 99  96 - 112 mEq/L   Comment: REPEATED TO VERIFY   CO2 21  19 - 32 mEq/L   Comment: REPEATED TO VERIFY   Glucose, Bld 166 (*) 70 - 99 mg/dL   BUN 10  6 - 23 mg/dL   Creatinine, Ser 0.80  0.50 - 1.35 mg/dL   Calcium 11.0 (*) 8.4 - 10.5 mg/dL   Total Protein 8.5 (*) 6.0 - 8.3 g/dL   Albumin 5.4 (*) 3.5 - 5.2 g/dL   AST 23  0 - 37 U/L   Comment: SLIGHT HEMOLYSIS     HEMOLYSIS AT THIS LEVEL MAY AFFECT RESULT   ALT 15  0 - 53 U/L   Alkaline Phosphatase 62  39 - 117 U/L   Total Bilirubin 0.9  0.3 - 1.2 mg/dL   GFR calc non Af Amer >90  >90 mL/min   GFR calc Af Amer >90  >90 mL/min   Comment: (NOTE)     The eGFR has been calculated using the CKD EPI equation.     This calculation has not been validated in all clinical situations.     eGFR's persistently <90 mL/min signify possible Chronic  Kidney     Disease.   Anion gap 21 (*) 5 - 15   Comment: REPEATED TO VERIFY  LIPASE, BLOOD     Status: None   Collection Time    10/05/13  8:36 PM      Result Value Ref Range   Lipase 28  11 - 59 U/L  CBC WITH DIFFERENTIAL     Status:  Abnormal   Collection Time    10/05/13  9:05 PM      Result Value Ref Range   WBC 15.1 (*) 4.0 - 10.5 K/uL   RBC 4.99  4.22 - 5.81 MIL/uL   Hemoglobin 15.3  13.0 - 17.0 g/dL   HCT 44.7  39.0 - 52.0 %   MCV 89.6  78.0 - 100.0 fL   MCH 30.7  26.0 - 34.0 pg   MCHC 34.2  30.0 - 36.0 g/dL   RDW 12.9  11.5 - 15.5 %   Platelets 251  150 - 400 K/uL   Neutrophils Relative % 88 (*) 43 - 77 %   Neutro Abs 13.3 (*) 1.7 - 7.7 K/uL   Lymphocytes Relative 6 (*) 12 - 46 %   Lymphs Abs 0.9  0.7 - 4.0 K/uL   Monocytes Relative 6  3 - 12 %   Monocytes Absolute 0.9  0.1 - 1.0 K/uL   Eosinophils Relative 0  0 - 5 %   Eosinophils Absolute 0.0  0.0 - 0.7 K/uL   Basophils Relative 0  0 - 1 %   Basophils Absolute 0.0  0.0 - 0.1 K/uL  POC OCCULT BLOOD, ED     Status: Abnormal   Collection Time    10/05/13  9:32 PM      Result Value Ref Range   Fecal Occult Bld POSITIVE (*) NEGATIVE  TROPONIN I     Status: None   Collection Time    10/05/13 10:05 PM      Result Value Ref Range   Troponin I <0.30  <0.30 ng/mL   Comment:            Due to the release kinetics of cTnI,     a negative result within the first hours     of the onset of symptoms does not rule out     myocardial infarction with certainty.     If myocardial infarction is still suspected,     repeat the test at appropriate intervals.  OCCULT BLOOD GASTRIC / DUODENUM (SPECIMEN CUP)     Status: Abnormal   Collection Time    10/06/13 12:27 AM      Result Value Ref Range   pH, Gastric 3     Occult Blood, Gastric POSITIVE (*) NEGATIVE  URINE RAPID DRUG SCREEN (HOSP PERFORMED)     Status: Abnormal   Collection Time    10/06/13  1:20 AM      Result Value Ref Range   Opiates NONE DETECTED  NONE DETECTED    Cocaine NONE DETECTED  NONE DETECTED   Benzodiazepines NONE DETECTED  NONE DETECTED   Amphetamines NONE DETECTED  NONE DETECTED   Tetrahydrocannabinol POSITIVE (*) NONE DETECTED   Barbiturates NONE DETECTED  NONE DETECTED   Comment:            DRUG SCREEN FOR MEDICAL PURPOSES     ONLY.  IF CONFIRMATION IS NEEDED     FOR ANY PURPOSE, NOTIFY LAB     WITHIN 5 DAYS.                LOWEST DETECTABLE LIMITS     FOR URINE DRUG SCREEN     Drug Class       Cutoff (ng/mL)     Amphetamine      1000     Barbiturate      200     Benzodiazepine   324     Tricyclics  300     Opiates          300     Cocaine          300     THC              50  CBC     Status: None   Collection Time    10/06/13  5:22 AM      Result Value Ref Range   WBC 8.0  4.0 - 10.5 K/uL   RBC 4.49  4.22 - 5.81 MIL/uL   Hemoglobin 13.7  13.0 - 17.0 g/dL   HCT 40.5  39.0 - 52.0 %   MCV 90.2  78.0 - 100.0 fL   MCH 30.5  26.0 - 34.0 pg   MCHC 33.8  30.0 - 36.0 g/dL   RDW 13.1  11.5 - 15.5 %   Platelets 235  150 - 400 K/uL  COMPREHENSIVE METABOLIC PANEL     Status: Abnormal   Collection Time    10/06/13  5:22 AM      Result Value Ref Range   Sodium 142  137 - 147 mEq/L   Potassium 4.6  3.7 - 5.3 mEq/L   Comment: RESULT REPEATED AND VERIFIED     DELTA CHECK NOTED   Chloride 104  96 - 112 mEq/L   CO2 25  19 - 32 mEq/L   Glucose, Bld 125 (*) 70 - 99 mg/dL   BUN 9  6 - 23 mg/dL   Creatinine, Ser 0.76  0.50 - 1.35 mg/dL   Calcium 9.5  8.4 - 10.5 mg/dL   Total Protein 6.9  6.0 - 8.3 g/dL   Albumin 4.2  3.5 - 5.2 g/dL   AST 15  0 - 37 U/L   ALT 12  0 - 53 U/L   Alkaline Phosphatase 51  39 - 117 U/L   Total Bilirubin 0.7  0.3 - 1.2 mg/dL   GFR calc non Af Amer >90  >90 mL/min   GFR calc Af Amer >90  >90 mL/min   Comment: (NOTE)     The eGFR has been calculated using the CKD EPI equation.     This calculation has not been validated in all clinical situations.     eGFR's persistently <90 mL/min signify  possible Chronic Kidney     Disease.   Anion gap 13  5 - 15   Nm Hepatobiliary Liver Func  10/06/2013   CLINICAL DATA:  Left upper quadrant pain. Nausea and vomiting. Possible cholecystitis.  EXAM: NUCLEAR MEDICINE HEPATOBILIARY IMAGING  TECHNIQUE: Sequential images of the abdomen were obtained out to 60 minutes following intravenous administration of radiopharmaceutical.  RADIOPHARMACEUTICALS:  5.3 Millicurie OM-35D Choletec  COMPARISON:  CT abdomen pelvis 10/06/2013  FINDINGS: There is homogeneous uptake throughout the liver parenchyma. Gallbladder filling begins at 10 min and continues throughout study. Common bile duct activity is seen by 10 min. Duodenal activity is seen at 30-35 min.  IMPRESSION: Normal hepatobiliary imaging.  No evidence of cholecystitis.   Electronically Signed   By: Curlene Dolphin M.D.   On: 10/06/2013 11:46   Ct Abdomen Pelvis W Contrast  10/06/2013   CLINICAL DATA:  Elevated white blood cell count; nausea, vomiting, and diarrhea  EXAM: CT ABDOMEN AND PELVIS WITH CONTRAST  TECHNIQUE: Multidetector CT imaging of the abdomen and pelvis was performed using the standard protocol following bolus administration of intravenous contrast. Oral contrast was also administered.  CONTRAST:  13m OMNIPAQUE IOHEXOL  300 MG/ML  SOLN  COMPARISON:  July 04, 2013  FINDINGS: Lung bases are clear.  There is a small hiatal hernia.  There is fatty change in the liver. No focal liver lesions are identified. There is no appreciable biliary duct dilatation. There is evidence suggesting a degree of pericholecystic fluid. Gallbladder wall does not appear thickened, and no gallstones are seen by CT.  Spleen, pancreas, and adrenals appear normal. Kidneys bilaterally show no mass or hydronephrosis on either side. There is no renal or ureteral calculus apparent on either side.  In the pelvis, the urinary bladder is midline with normal wall thickness. There is no pelvic mass or fluid collection.  The colon is  largely decompressed. No frank colonic wall thickening is appreciated on this study. Appendix appears normal. The terminal ileum appears within normal limits.  There is no apparent bowel obstruction. No free air or portal venous air.  There is no ascites, adenopathy, or abscess in the abdomen or pelvis. There is no abdominal aortic aneurysm apparent. There are no blastic or lytic bone lesions.  IMPRESSION: Findings concerning for pericholecystic fluid. Advise correlation with ultrasound of the gallbladder to further evaluate.  Small hiatal hernia.  No bowel obstruction. No abscess. Appendix appears normal. No renal or ureteral calculus. No hydronephrosis. The colon is largely decompressed. Colon wall is not felt to be thickened when allowing for the degree of colonic decompression.   Electronically Signed   By: Lowella Grip M.D.   On: 10/06/2013 00:58   Dg Abd Acute W/chest  10/05/2013   CLINICAL DATA:  nausea and vomiting  EXAM: ACUTE ABDOMEN SERIES (ABDOMEN 2 VIEW & CHEST 1 VIEW)  COMPARISON:  Prior CT from 07/04/2013  FINDINGS: The cardiac and mediastinal silhouettes are stable in size and contour, and remain within normal limits.  The lungs are normally inflated. No airspace consolidation, pleural effusion, or pulmonary edema is identified. There is no pneumothorax.  Bowel gas pattern is unremarkable without evidence of obstruction or ileus. No abnormal bowel wall thickening. No free intraperitoneal air. No soft tissue mass or abnormal calcifications.  No acute osseus abnormality.  IMPRESSION: 1. Nonobstructive bowel gas pattern with no radiographic evidence of acute intra-abdominal process. 2. No acute cardiopulmonary abnormality.   Electronically Signed   By: Jeannine Boga M.D.   On: 10/05/2013 20:46   US Abdomen Limited Ruq  10/06/2013   CLINICAL DATA:  Followup CT abnormality.  EXAM: US ABDOMEN LIMITED - RIGHT UPPER QUADRANT  COMPARISON:  CT of the abdomen and pelvis October 06, 2013 at 0048  hr  FINDINGS: Gallbladder:  Gallbladder wall thickening to 7 mm with trace pericholecystic fluid. No sonographic findings of cholelithiasis. Limited assessment for Murphy's sign as patient is on pain medication.  Common bile duct:  Diameter: 4 mm  Liver:  No focal lesion identified. Within normal limits in parenchymal echogenicity. Hepatopetal portal vein.  IMPRESSION: Gallbladder wall thickening with trace pericholecystic fluid, could reflect acalculus cholecystitis, limited assessment for sonographic Murphy sign as patient is on pain medication. Findings of likely reflect sequelae of portal hypertension.   Electronically Signed   By: Elon Alas   On: 10/06/2013 02:12      Assessment/Plan Abdominal pain (upper abdomen) Nausea/vomiting Leukocytosis Differential diagnosis:  poor diet, smoking/marijuana use, alcohol use, caffeine use, anxiety related GI disorder, IBS, GERD/PUD, eating disorder, viral illness Bipolar Frequent admission in the past for "cyclical vomiting secondary to marijuana"  1.  He does not have a surgical abdomen, his HIDA  is negative, no convincing findings on CT/MRI, labs are normal today as well as vitals. 2.  This patient has been admitted multiple times for the same problem.  His diet, smoking, marijuana use, alcohol/caffeine intake, lack of exercise, and mental health issues are likely playing a role in this patients symptoms.  Although his WBC was elevated upon arrival it could have been a simple virus from [employer] food. 3.  Likely would need OP GI workup, and mental health integration, behavioral modifications (quit smoking, drinking alcohol/caffeine, using marijuana, healthy diet and exercise) 4.  Advance diet as tolerated 5.  No further recommendations, will sign off   Coralie Keens, [facility] 10/06/2013, 12:42 PM Pager: [phone]  The interview, history, and reviewing of past medical records required 60 minutes.

## 2013-10-06 NOTE — Consult Note (Signed)
Patient interviewed and examined, recent imaging reviewed, agree with PA note above. I do not see any significant evidence for gallbladder disease or surgical abdomen. He is much improved as I see him this afternoon.  Mariella SaaBenjamin T Christeen Lai MD, FACS  10/06/2013 5:24 PM

## 2013-10-06 NOTE — Progress Notes (Signed)
PROGRESS NOTE    Preston Brewer JXB:[id number] DOB: February 04, 1986 DOA: 10/05/2013 PCP: No PCP Per Patient  HPI/Brief narrative 28 year old male patient with history of bipolar disorder, admitted on 10/06/13 with complaints of intractable emesis and abdominal pain. He works at [employer] and apparently consumed some stale food on Wednesday and symptoms started the next day. His had several episodes of emesis which initially were food that he had consumed but subsequently light brown in color without frank blood. He had associated mid abdominal pain. He denies diarrhea. He had chills without fevers. Denies contact with others with similar complaints. He has prior history of recurrent nausea and vomiting of undetermined etiology. He has history of substance abuse. Since admission, his symptoms have improved.     Assessment/Plan:  1. Nausea, vomiting and abdominal pain: DD-acute gastroenteritis, food poisoning, cyclical vomiting syndrome, other etiologies. CT and ultrasound abdomen had shown thickening of gallbladder with pericholecystic fluid but HIDA scan is negative. Surgical input appreciated-does not have a surgical abdomen. Start clear liquid diet and advance diet as tolerated. Treat supportively with PPI, when necessary antiemetics and minimize pain medications. 2. Leukocytosis: Likely stress response. Resolved. 3. History of substance abuse:-Tobacco, marijuana. Denies recent alcohol consumption. Cessation counseled. Placed on Ativan protocol. 4. History of bipolar disorder: Outpatient followup. Stable   Code Status:  full Family Communication:  discussed with patient's girlfriend at bedside. Disposition Plan:  home when medically stable.   Consultants:   general surgery  Procedures:   none  Antibiotics:   none   Subjective:  feels better with improvement in abdominal pain. Has not vomited since midnight last night. Asking for food.  Objective: Filed Vitals:   10/05/13 2244  10/05/13 2314 10/06/13 0403 10/06/13 1440  BP: 111/58 123/73 119/60 110/62  Pulse: 39  51 49  Temp:   98.9 F (37.2 C) 98.8 F (37.1 C)  TempSrc:   Oral   Resp: 16 17 16 16   Height:   6\' 1"  (1.854 m)   Weight:   81.647 kg (180 lb)   SpO2: 100%  100% 100%   No intake or output data in the 24 hours ending 10/06/13 1724 Filed Weights   10/06/13 0403  Weight: 81.647 kg (180 lb)     Exam:  General exam:  pleasant young male lying comfortably in bed. Respiratory system: Clear. No increased work of breathing. Cardiovascular system: S1 & S2 heard, RRR. No JVD, murmurs, gallops, clicks or pedal edema. Gastrointestinal system: Abdomen is nondistended, soft. Mild epigastric/periumbilical region tenderness without peritoneal signs. Negative Murphy sign . Normal bowel sounds heard. Central nervous system: Alert and oriented. No focal neurological deficits. Extremities: Symmetric 5 x 5 power.   Data Reviewed: Basic Metabolic Panel:  Recent Labs Lab 10/05/13 2036 10/06/13 0522  NA 141 142  K 3.8 4.6  CL 99 104  CO2 21 25  GLUCOSE 166* 125*  BUN 10 9  CREATININE 0.80 0.76  CALCIUM 11.0* 9.5   Liver Function Tests:  Recent Labs Lab 10/05/13 2036 10/06/13 0522  AST 23 15  ALT 15 12  ALKPHOS 62 51  BILITOT 0.9 0.7  PROT 8.5* 6.9  ALBUMIN 5.4* 4.2    Recent Labs Lab 10/05/13 2036  LIPASE 28   No results found for this basename: AMMONIA,  in the last 168 hours CBC:  Recent Labs Lab 10/05/13 2105 10/06/13 0522  WBC 15.1* 8.0  NEUTROABS 13.3*  --   HGB 15.3 13.7  HCT 44.7 40.5  MCV 89.6 90.2  PLT 251 235   Cardiac Enzymes:  Recent Labs Lab 10/05/13 2205  TROPONINI <0.30   BNP (last 3 results) No results found for this basename: PROBNP,  in the last 8760 hours CBG: No results found for this basename: GLUCAP,  in the last 168 hours  No results found for this or any previous visit (from the past 240 hour(s)).    Additional labs: 1.  UDS positive for  THC.     Studies: Nm Hepatobiliary Liver Func  10/06/2013   CLINICAL DATA:  Left upper quadrant pain. Nausea and vomiting. Possible cholecystitis.  EXAM: NUCLEAR MEDICINE HEPATOBILIARY IMAGING  TECHNIQUE: Sequential images of the abdomen were obtained out to 60 minutes following intravenous administration of radiopharmaceutical.  RADIOPHARMACEUTICALS:  5.3 Millicurie Tc-12m Choletec  COMPARISON:  CT abdomen pelvis 10/06/2013  FINDINGS: There is homogeneous uptake throughout the liver parenchyma. Gallbladder filling begins at 10 min and continues throughout study. Common bile duct activity is seen by 10 min. Duodenal activity is seen at 30-35 min.  IMPRESSION: Normal hepatobiliary imaging.  No evidence of cholecystitis.   Electronically Signed   By: Britta Mccreedy M.D.   On: 10/06/2013 11:46   Ct Abdomen Pelvis W Contrast  10/06/2013   CLINICAL DATA:  Elevated white blood cell count; nausea, vomiting, and diarrhea  EXAM: CT ABDOMEN AND PELVIS WITH CONTRAST  TECHNIQUE: Multidetector CT imaging of the abdomen and pelvis was performed using the standard protocol following bolus administration of intravenous contrast. Oral contrast was also administered.  CONTRAST:  OMNIPAQUE IOHEXOL 300 MG/ML  SOLN  COMPARISON:  July 04, 2013  FINDINGS: Lung bases are clear.  There is a small hiatal hernia.  There is fatty change in the liver. No focal liver lesions are identified. There is no appreciable biliary duct dilatation. There is evidence suggesting a degree of pericholecystic fluid. Gallbladder wall does not appear thickened, and no gallstones are seen by CT.  Spleen, pancreas, and adrenals appear normal. Kidneys bilaterally show no mass or hydronephrosis on either side. There is no renal or ureteral calculus apparent on either side.  In the pelvis, the urinary bladder is midline with normal wall thickness. There is no pelvic mass or fluid collection.  The colon is largely decompressed. No frank colonic wall  thickening is appreciated on this study. Appendix appears normal. The terminal ileum appears within normal limits.  There is no apparent bowel obstruction. No free air or portal venous air.  There is no ascites, adenopathy, or abscess in the abdomen or pelvis. There is no abdominal aortic aneurysm apparent. There are no blastic or lytic bone lesions.  IMPRESSION: Findings concerning for pericholecystic fluid. Advise correlation with ultrasound of the gallbladder to further evaluate.  Small hiatal hernia.  No bowel obstruction. No abscess. Appendix appears normal. No renal or ureteral calculus. No hydronephrosis. The colon is largely decompressed. Colon wall is not felt to be thickened when allowing for the degree of colonic decompression.   Electronically Signed   By: Bretta Bang M.D.   On: 10/06/2013 00:58   Dg Abd Acute W/chest  10/05/2013   CLINICAL DATA:  nausea and vomiting  EXAM: ACUTE ABDOMEN SERIES (ABDOMEN 2 VIEW & CHEST 1 VIEW)  COMPARISON:  Prior CT from 07/04/2013  FINDINGS: The cardiac and mediastinal silhouettes are stable in size and contour, and remain within normal limits.  The lungs are normally inflated. No airspace consolidation, pleural effusion, or pulmonary edema is identified. There is no pneumothorax.  Bowel gas pattern  is unremarkable without evidence of obstruction or ileus. No abnormal bowel wall thickening. No free intraperitoneal air. No soft tissue mass or abnormal calcifications.  No acute osseus abnormality.  IMPRESSION: 1. Nonobstructive bowel gas pattern with no radiographic evidence of acute intra-abdominal process. 2. No acute cardiopulmonary abnormality.   Electronically Signed   By: Rise Mu M.D.   On: 10/05/2013 20:46   US Abdomen Limited Ruq  10/06/2013   CLINICAL DATA:  Followup CT abnormality.  EXAM: US ABDOMEN LIMITED - RIGHT UPPER QUADRANT  COMPARISON:  CT of the abdomen and pelvis October 06, 2013 at 0048 hr  FINDINGS: Gallbladder:  Gallbladder wall  thickening to 7 mm with trace pericholecystic fluid. No sonographic findings of cholelithiasis. Limited assessment for Murphy's sign as patient is on pain medication.  Common bile duct:  Diameter: 4 mm  Liver:  No focal lesion identified. Within normal limits in parenchymal echogenicity. Hepatopetal portal vein.  IMPRESSION: Gallbladder wall thickening with trace pericholecystic fluid, could reflect acalculus cholecystitis, limited assessment for sonographic Murphy sign as patient is on pain medication. Findings of likely reflect sequelae of portal hypertension.   Electronically Signed   By: Awilda Metro   On: 10/06/2013 02:12        Scheduled Meds: . lamoTRIgine  100 mg Oral Daily  . pantoprazole  40 mg Oral Daily   Continuous Infusions:   Principal Problem:   Abdominal pain, acute, left upper quadrant Active Problems:   Nausea and vomiting    Time spent: 35 minutes.    Marcellus Scott, MD, FACP, FHM. [facility] Pager [phone]  If 7PM-7AM, please contact night-coverage [email or web address] Password [email or web address] 10/06/2013, 5:24 PM    LOS: 1 day

## 2013-10-07 ENCOUNTER — Other Ambulatory Visit (HOSPITAL_COMMUNITY): Payer: Managed Care, Other (non HMO)

## 2013-10-07 NOTE — Discharge Instructions (Signed)
Abdominal Pain °Many things can cause abdominal pain. Usually, abdominal pain is not caused by a disease and will improve without treatment. It can often be observed and treated at home. Your health care provider will do a physical exam and possibly order blood tests and X-rays to help determine the seriousness of your pain. However, in many cases, more time must pass before a clear cause of the pain can be found. Before that point, your health care provider may not know if you need more testing or further treatment. °HOME CARE INSTRUCTIONS  °Monitor your abdominal pain for any changes. The following actions may help to alleviate any discomfort you are experiencing: °· Only take over-the-counter or prescription medicines as directed by your health care provider. °· Do not take laxatives unless directed to do so by your health care provider. °· Try a clear liquid diet (broth, tea, or water) as directed by your health care provider. Slowly move to a bland diet as tolerated. °SEEK MEDICAL CARE IF: °· You have unexplained abdominal pain. °· You have abdominal pain associated with nausea or diarrhea. °· You have pain when you urinate or have a bowel movement. °· You experience abdominal pain that wakes you in the night. °· You have abdominal pain that is worsened or improved by eating food. °· You have abdominal pain that is worsened with eating fatty foods. °· You have a fever. °SEEK IMMEDIATE MEDICAL CARE IF:  °· Your pain does not go away within 2 hours. °· You keep throwing up (vomiting). °· Your pain is felt only in portions of the abdomen, such as the right side or the left lower portion of the abdomen. °· You pass bloody or black tarry stools. °MAKE SURE YOU: °· Understand these instructions.   °· Will watch your condition.   °· Will get help right away if you are not doing well or get worse.   °Document Released: 12/17/2004 Document Revised: 03/14/2013 Document Reviewed: 11/16/2012 °ExitCare® Patient Information  ©2015 [employer]. This information is not intended to replace advice given to you by your health care provider. Make sure you discuss any questions you have with your health care provider. ° °Chronic Pain °Chronic pain can be defined as pain that is off and on and lasts for 3-6 months or longer. Many things cause chronic pain, which can make it difficult to make a diagnosis. There are many treatment options available for chronic pain. However, finding a treatment that works well for you may require trying various approaches until the right one is found. Many people benefit from a combination of two or more types of treatment to control their pain. °SYMPTOMS  °Chronic pain can occur anywhere in the body and can range from mild to very severe. Some types of chronic pain include: °· Headache. °· Low back pain. °· Cancer pain. °· Arthritis pain. °· Neurogenic pain. This is pain resulting from damage to nerves. ° People with chronic pain may also have other symptoms such as: °· Depression. °· Anger. °· Insomnia. °· Anxiety. °DIAGNOSIS  °Your health care provider will help diagnose your condition over time. In many cases, the initial focus will be on excluding possible conditions that could be causing the pain. Depending on your symptoms, your health care provider may order tests to diagnose your condition. Some of these tests may include:  °· Blood tests.   °· CT scan.   °· MRI.   °· X-rays.   °· Ultrasounds.   °· Nerve conduction studies.   °You may need to see a specialist.  °  TREATMENT  Finding treatment that works well may take time. You may be referred to a pain specialist. He or she may prescribe medicine or therapies, such as:   Mindful meditation or yoga.  Shots (injections) of numbing or pain-relieving medicines into the spine or area of pain.  Local electrical stimulation.  Acupuncture.   Massage therapy.   Aroma, color, light, or sound therapy.   Biofeedback.   Working with a physical  therapist to keep from getting stiff.   Regular, gentle exercise.   Cognitive or behavioral therapy.   Group support.  Sometimes, surgery may be recommended.  HOME CARE INSTRUCTIONS   Take all medicines as directed by your health care provider.   Lessen stress in your life by relaxing and doing things such as listening to calming music.   Exercise or be active as directed by your health care provider.   Eat a healthy diet and include things such as vegetables, fruits, fish, and lean meats in your diet.   Keep all follow-up appointments with your health care provider.   Attend a support group with others suffering from chronic pain. SEEK MEDICAL CARE IF:   Your pain gets worse.   You develop a new pain that was not there before.   You cannot tolerate medicines given to you by your health care provider.   You have new symptoms since your last visit with your health care provider.  SEEK IMMEDIATE MEDICAL CARE IF:   You feel weak.   You have decreased sensation or numbness.   You lose control of bowel or bladder function.   Your pain suddenly gets much worse.   You develop shaking.  You develop chills.  You develop confusion.  You develop chest pain.  You develop shortness of breath.  MAKE SURE YOU:  Understand these instructions.  Will watch your condition.  Will get help right away if you are not doing well or get worse. Document Released: 11/29/2001 Document Revised: 11/09/2012 Document Reviewed: 09/02/2012 [facility] Patient Information 2015 [place], [place]. This information is not intended to replace advice given to you by your health care provider. Make sure you discuss any questions you have with your health care provider.  Emotional Crisis Part of your problem today may be due to an emotional crisis. Emotional states can cause many different physical signs and symptoms. These may include:  Chest or stomach pain.  Fluttering  heartbeat.  Passing out.  Breathing difficulty.  Headaches.  Trembling.  Hot or cold flashes.  Numbness.  Dizziness.  Unusual muscle pain or fatigue.  Insomnia. When you have other medical problems, they are often made worse by emotional upsets. Emotional crises can increase your stress and anxiety. Finding ways to reduce your stress level can make you feel better. You will become more capable of dealing with these emotional states. Regular physical exercise such as walking can be very beneficial. Counseling or medicine to treat anxiety or depression may also be needed. See your caregiver if you have further problems or questions about your condition. Document Released: 03/09/2005 Document Revised: 06/01/2011 Document Reviewed: 08/24/2006 [facility]Exit[facility] Patient Information 2015 [place]ExitCare, [place]. This information is not intended to replace advice given to you by your health care provider. Make sure you discuss any questions you have with your health care provider.

## 2013-10-07 NOTE — Progress Notes (Signed)
Pt alert x4 v/s stable discharge instructions given. Pt verbalizes understanding. No c/o pain, iv d/c, pt's will be d/c home in private  Vehicle.  Will monitor. Estill DoomsSimon, Raygen Linquist Mahario, RN 2:58 AM 10/07/2013   .

## 2013-10-09 NOTE — ED Provider Notes (Signed)
Medical screening examination/treatment/procedure(s) were conducted as a shared visit with non-physician practitioner(s) and myself.  I personally evaluated the patient during the encounter.   EKG Interpretation   Date/Time:  Thursday October 05 2013 23:13:08 EDT Ventricular Rate:  40 PR Interval:  124 QRS Duration: 111 QT Interval:  462 QTC Calculation: 377 R Axis:   98 Text Interpretation:  Sinus bradycardia Consider right ventricular  hypertrophy Similar to prior Confirmed by Gwendolyn GrantWALDEN  MD, Ricci Dirocco ([id number]) on  10/05/2013 11:16:46 PM      Patient here with persistent intractable vomiting. Patient became bradycardic in the 30s while I was in the room, was in a normal mental status, no CP, SOB. Had been sleeping. Not an athlete. Not vomiting or vagaling, unclear why he was bradycardic. Admitted.  Dagmar HaitWilliam Domonique Brouillard, MD 10/09/13 [id number]

## 2013-10-09 NOTE — ED Provider Notes (Signed)
Medical screening examination/treatment/procedure(s) were performed by non-physician practitioner and as supervising physician I was immediately available for consultation/collaboration.   EKG Interpretation   Date/Time:  Thursday October 05 2013 23:13:08 EDT Ventricular Rate:  40 PR Interval:  124 QRS Duration: 111 QT Interval:  462 QTC Calculation: 377 R Axis:   98 Text Interpretation:  Sinus bradycardia Consider right ventricular  hypertrophy Similar to prior Confirmed by Gwendolyn GrantWALDEN  MD, BLAIR ([id number]) on  10/05/2013 11:16:46 PM        Hanley SeamenJohn L Alayssa Flinchum, MD 10/09/13 2243

## 2013-12-30 ENCOUNTER — Emergency Department (HOSPITAL_COMMUNITY)
Admission: EM | Admit: 2013-12-30 | Discharge: 2013-12-30 | Disposition: A | Discharge status: Home / Self Care | Payer: Managed Care, Other (non HMO) | Attending: Emergency Medicine | Admitting: Emergency Medicine

## 2013-12-30 ENCOUNTER — Encounter (HOSPITAL_COMMUNITY): Payer: Self-pay | Admitting: Emergency Medicine

## 2013-12-30 DIAGNOSIS — F1110 Opioid abuse, uncomplicated: Secondary | ICD-10-CM | POA: Diagnosis not present

## 2013-12-30 DIAGNOSIS — Z720 Tobacco use: Secondary | ICD-10-CM | POA: Insufficient documentation

## 2013-12-30 DIAGNOSIS — F1210 Cannabis abuse, uncomplicated: Secondary | ICD-10-CM | POA: Diagnosis not present

## 2013-12-30 DIAGNOSIS — Z79899 Other long term (current) drug therapy: Secondary | ICD-10-CM | POA: Diagnosis not present

## 2013-12-30 DIAGNOSIS — Z872 Personal history of diseases of the skin and subcutaneous tissue: Secondary | ICD-10-CM | POA: Diagnosis not present

## 2013-12-30 DIAGNOSIS — R112 Nausea with vomiting, unspecified: Secondary | ICD-10-CM | POA: Insufficient documentation

## 2013-12-30 DIAGNOSIS — E860 Dehydration: Secondary | ICD-10-CM | POA: Insufficient documentation

## 2013-12-30 DIAGNOSIS — Z8719 Personal history of other diseases of the digestive system: Secondary | ICD-10-CM | POA: Diagnosis not present

## 2013-12-30 DIAGNOSIS — F319 Bipolar disorder, unspecified: Secondary | ICD-10-CM | POA: Insufficient documentation

## 2013-12-30 LAB — CBC WITH DIFFERENTIAL/PLATELET
Basophils Absolute: 0 10*3/uL (ref 0.0–0.1)
Basophils Relative: 0 % (ref 0–1)
Eosinophils Absolute: 0 10*3/uL (ref 0.0–0.7)
Eosinophils Relative: 0 % (ref 0–5)
HEMATOCRIT: 46.4 % (ref 39.0–52.0)
Hemoglobin: 16.4 g/dL (ref 13.0–17.0)
LYMPHS PCT: 11 % — AB (ref 12–46)
Lymphs Abs: 1.6 10*3/uL (ref 0.7–4.0)
MCH: 31.8 pg (ref 26.0–34.0)
MCHC: 35.3 g/dL (ref 30.0–36.0)
MCV: 90.1 fL (ref 78.0–100.0)
Monocytes Absolute: 1.1 10*3/uL — ABNORMAL HIGH (ref 0.1–1.0)
Monocytes Relative: 8 % (ref 3–12)
NEUTROS ABS: 11.6 10*3/uL — AB (ref 1.7–7.7)
Neutrophils Relative %: 81 % — ABNORMAL HIGH (ref 43–77)
PLATELETS: 259 10*3/uL (ref 150–400)
RBC: 5.15 MIL/uL (ref 4.22–5.81)
RDW: 12.9 % (ref 11.5–15.5)
WBC: 14.3 10*3/uL — ABNORMAL HIGH (ref 4.0–10.5)

## 2013-12-30 LAB — COMPREHENSIVE METABOLIC PANEL
ALK PHOS: 60 U/L (ref 39–117)
ALT: 13 U/L (ref 0–53)
AST: 15 U/L (ref 0–37)
Albumin: 5.3 g/dL — ABNORMAL HIGH (ref 3.5–5.2)
Anion gap: 22 — ABNORMAL HIGH (ref 5–15)
BILIRUBIN TOTAL: 1.2 mg/dL (ref 0.3–1.2)
BUN: 13 mg/dL (ref 6–23)
CHLORIDE: 99 meq/L (ref 96–112)
CO2: 18 meq/L — AB (ref 19–32)
Calcium: 10.7 mg/dL — ABNORMAL HIGH (ref 8.4–10.5)
Creatinine, Ser: 0.91 mg/dL (ref 0.50–1.35)
GFR calc Af Amer: >90 mL/min (ref >90)
Glucose, Bld: 153 mg/dL — ABNORMAL HIGH (ref 70–99)
Potassium: 3.4 mEq/L — ABNORMAL LOW (ref 3.7–5.3)
SODIUM: 139 meq/L (ref 137–147)
Total Protein: 8.5 g/dL — ABNORMAL HIGH (ref 6.0–8.3)

## 2013-12-30 LAB — URINALYSIS, ROUTINE W REFLEX MICROSCOPIC
BILIRUBIN URINE: NEGATIVE
Glucose, UA: NEGATIVE mg/dL
Hgb urine dipstick: NEGATIVE
Leukocytes, UA: NEGATIVE
Nitrite: NEGATIVE
PH: 7.5 (ref 5.0–8.0)
Protein, ur: NEGATIVE mg/dL
Specific Gravity, Urine: 1.019 (ref 1.005–1.030)
Urobilinogen, UA: 0.2 mg/dL (ref 0.0–1.0)

## 2013-12-30 LAB — LIPASE, BLOOD: Lipase: 31 U/L (ref 11–59)

## 2013-12-30 LAB — I-STAT CHEM 8, ED
BUN: 10 mg/dL (ref 6–23)
CALCIUM ION: 1.1 mmol/L — AB (ref 1.12–1.23)
CHLORIDE: 109 meq/L (ref 96–112)
Creatinine, Ser: 0.7 mg/dL (ref 0.50–1.35)
Glucose, Bld: 124 mg/dL — ABNORMAL HIGH (ref 70–99)
HCT: 44 % (ref 39.0–52.0)
Hemoglobin: 15 g/dL (ref 13.0–17.0)
Potassium: 3.7 mEq/L (ref 3.7–5.3)
Sodium: 142 mEq/L (ref 137–147)
TCO2: 20 mmol/L (ref 0–100)

## 2013-12-30 LAB — RAPID URINE DRUG SCREEN, HOSP PERFORMED
Amphetamines: NOT DETECTED
BENZODIAZEPINES: NOT DETECTED
Barbiturates: NOT DETECTED
COCAINE: NOT DETECTED
OPIATES: POSITIVE — AB
Tetrahydrocannabinol: POSITIVE — AB

## 2013-12-30 LAB — I-STAT CG4 LACTIC ACID, ED: LACTIC ACID, VENOUS: 1.44 mmol/L (ref 0.5–2.2)

## 2013-12-30 MED ORDER — SODIUM CHLORIDE 0.9 % IV SOLN
1000.0000 mL | Freq: Once | INTRAVENOUS | Status: AC
  Administered 2013-12-30: 1000 mL via INTRAVENOUS

## 2013-12-30 MED ORDER — MORPHINE SULFATE 4 MG/ML IJ SOLN
6.0000 mg | Freq: Once | INTRAMUSCULAR | Status: AC
  Administered 2013-12-30: 6 mg via INTRAVENOUS
  Filled 2013-12-30: qty 2

## 2013-12-30 MED ORDER — METOCLOPRAMIDE HCL 5 MG/ML IJ SOLN
10.0000 mg | Freq: Once | INTRAMUSCULAR | Status: AC
  Administered 2013-12-30: 10 mg via INTRAVENOUS
  Filled 2013-12-30: qty 2

## 2013-12-30 MED ORDER — LORAZEPAM 2 MG/ML IJ SOLN
1.0000 mg | Freq: Once | INTRAMUSCULAR | Status: AC
  Administered 2013-12-30: 1 mg via INTRAVENOUS
  Filled 2013-12-30: qty 1

## 2013-12-30 MED ORDER — MORPHINE SULFATE 4 MG/ML IJ SOLN
4.0000 mg | Freq: Once | INTRAMUSCULAR | Status: AC
  Administered 2013-12-30: 4 mg via INTRAVENOUS
  Filled 2013-12-30: qty 1

## 2013-12-30 MED ORDER — ONDANSETRON 8 MG PO TBDP: 8.0000 mg | ORAL_TABLET | Freq: Three times a day (TID) | ORAL | Status: DC | PRN

## 2013-12-30 MED ORDER — ONDANSETRON HCL 4 MG/2ML IJ SOLN
4.0000 mg | Freq: Once | INTRAMUSCULAR | Status: AC
  Administered 2013-12-30: 4 mg via INTRAVENOUS
  Filled 2013-12-30: qty 2

## 2013-12-30 MED ORDER — SODIUM CHLORIDE 0.9 % IV SOLN
1000.0000 mL | INTRAVENOUS | Status: DC
  Administered 2013-12-30: 1000 mL via INTRAVENOUS

## 2013-12-30 MED ORDER — METOCLOPRAMIDE HCL 10 MG PO TABS: 10.0000 mg | ORAL_TABLET | Freq: Three times a day (TID) | ORAL | Status: DC | PRN

## 2013-12-30 NOTE — Discharge Instructions (Signed)
Dehydration, Adult Dehydration is when you lose more fluids from the body than you take in. Vital organs like the kidneys, brain, and heart cannot function without a proper amount of fluids and salt. Any loss of fluids from the body can cause dehydration.  CAUSES   Vomiting.  Diarrhea.  Excessive sweating.  Excessive urine output.  Fever. SYMPTOMS  Mild dehydration  Thirst.  Dry lips.  Slightly dry mouth. Moderate dehydration  Very dry mouth.  Sunken eyes.  Skin does not bounce back quickly when lightly pinched and released.  Dark urine and decreased urine production.  Decreased tear production.  Headache. Severe dehydration  Very dry mouth.  Extreme thirst.  Rapid, weak pulse (more than 100 beats per minute at rest).  Cold hands and feet.  Not able to sweat in spite of heat and temperature.  Rapid breathing.  Blue lips.  Confusion and lethargy.  Difficulty being awakened.  Minimal urine production.  No tears. DIAGNOSIS  Your caregiver will diagnose dehydration based on your symptoms and your exam. Blood and urine tests will help confirm the diagnosis. The diagnostic evaluation should also identify the cause of dehydration. TREATMENT  Treatment of mild or moderate dehydration can often be done at home by increasing the amount of fluids that you drink. It is best to drink small amounts of fluid more often. Drinking too much at one time can make vomiting worse. Refer to the home care instructions below. Severe dehydration needs to be treated at the hospital where you will probably be given intravenous (IV) fluids that contain water and electrolytes. HOME CARE INSTRUCTIONS   Ask your caregiver about specific rehydration instructions.  Drink enough fluids to keep your urine clear or pale yellow.  Drink small amounts frequently if you have nausea and vomiting.  Eat as you normally do.  Avoid:  Foods or drinks high in sugar.  Carbonated  drinks.  Juice.  Extremely hot or cold fluids.  Drinks with caffeine.  Fatty, greasy foods.  Alcohol.  Tobacco.  Overeating.  Gelatin desserts.  Wash your hands well to avoid spreading bacteria and viruses.  Only take over-the-counter or prescription medicines for pain, discomfort, or fever as directed by your caregiver.  Ask your caregiver if you should continue all prescribed and over-the-counter medicines.  Keep all follow-up appointments with your caregiver. SEEK MEDICAL CARE IF:  You have abdominal pain and it increases or stays in one area (localizes).  You have a rash, stiff neck, or severe headache.  You are irritable, sleepy, or difficult to awaken.  You are weak, dizzy, or extremely thirsty. SEEK IMMEDIATE MEDICAL CARE IF:   You are unable to keep fluids down or you get worse despite treatment.  You have frequent episodes of vomiting or diarrhea.  You have blood or green matter (bile) in your vomit.  You have blood in your stool or your stool looks black and tarry.  You have not urinated in 6 to 8 hours, or you have only urinated a small amount of very dark urine.  You have a fever.  You faint. MAKE SURE YOU:   Understand these instructions.  Will watch your condition.  Will get help right away if you are not doing well or get worse. Document Released: 03/09/2005 Document Revised: 06/01/2011 Document Reviewed: 10/27/2010 ExitCare Patient Information 2015 Exit[employer]. This information is not intended to replace advice given to you by your health care provider. Make sure you discuss any questions you have with your health care   provider.  

## 2013-12-30 NOTE — ED Notes (Signed)
Dr. J at bedside 

## 2013-12-30 NOTE — ED Notes (Signed)
Patient sleeping and unable to give urine sample at this time. Dr. Patria Maneampos aware

## 2013-12-30 NOTE — ED Provider Notes (Signed)
CSN: [id number]     Arrival date & time 12/30/13  1319 History   First MD Initiated Contact with Patient 12/30/13 1342     Chief Complaint  Patient presents with  . Emesis      HPI Patient reports nausea vomiting since yesterday.  He states he's had this in the past with no clear etiology found.  He said at one time he was told he had cyclical vomiting syndrome.  He denies hematemesis.  No fevers or chills.  No diarrhea.  No urinary complaints.  Reports generalized crampy abdominal pain.   Past Medical History  Diagnosis Date  . Bipolar 1 disorder   . GI bleed   . H/O endoscopy   . Ulcer    No past surgical history on file. No family history on file. History  Substance Use Topics  . Smoking status: Current Every Day Smoker -- 1.00 packs/day    Types: Cigarettes  . Smokeless tobacco: Never Used  . Alcohol Use: 3.0 oz/week    5 Cans of beer per week    Review of Systems  All other systems reviewed and are negative.     Allergies  Review of patient's allergies indicates no known allergies.  Home Medications   Prior to Admission medications   Medication Sig Start Date End Date Taking? Authorizing Provider  lamoTRIgine (LAMICTAL) 100 MG tablet Take 100 mg by mouth every morning.    Yes Historical Provider, MD  omeprazole (PRILOSEC) 20 MG capsule Take 20 mg by mouth every morning.   Yes Historical Provider, MD  promethazine (PHENERGAN) 25 MG tablet Take 25 mg by mouth every 6 (six) hours as needed for nausea or vomiting.   Yes Historical Provider, MD  metoCLOPramide (REGLAN) 10 MG tablet Take 1 tablet (10 mg total) by mouth every 8 (eight) hours as needed for nausea or vomiting. 12/30/13   Lyanne CoKevin M Valita Righter, MD  ondansetron (ZOFRAN ODT) 8 MG disintegrating tablet Take 1 tablet (8 mg total) by mouth every 8 (eight) hours as needed for nausea or vomiting. 12/30/13   Lyanne CoKevin M Jarryd Gratz, MD   BP 113/58  Pulse 81  SpO2 94% Physical Exam  Nursing note and vitals  reviewed. Constitutional: He is oriented to person, place, and time. He appears well-developed and well-nourished.  HENT:  Head: Normocephalic and atraumatic.  Eyes: EOM are normal.  Neck: Normal range of motion.  Cardiovascular: Regular rhythm, normal heart sounds and intact distal pulses.   Pulmonary/Chest: Effort normal and breath sounds normal. No respiratory distress.  Abdominal: Soft. He exhibits no distension and no mass. There is no tenderness. There is no rebound and no guarding.  Musculoskeletal: Normal range of motion.  Neurological: He is alert and oriented to person, place, and time.  Skin: Skin is warm and dry.  Psychiatric: He has a normal mood and affect. Judgment normal.    ED Course  Procedures (including critical care time) Labs Review Labs Reviewed  CBC WITH DIFFERENTIAL - Abnormal; Notable for the following:    WBC 14.3 (*)    Neutrophils Relative % 81 (*)    Neutro Abs 11.6 (*)    Lymphocytes Relative 11 (*)    Monocytes Absolute 1.1 (*)    All other components within normal limits  COMPREHENSIVE METABOLIC PANEL - Abnormal; Notable for the following:    Potassium 3.4 (*)    CO2 18 (*)    Glucose, Bld 153 (*)    Calcium 10.7 (*)    Total Protein  8.5 (*)    Albumin 5.3 (*)    Anion gap 22 (*)    All other components within normal limits  LIPASE, BLOOD  URINE RAPID DRUG SCREEN (HOSP PERFORMED)  URINALYSIS, ROUTINE W REFLEX MICROSCOPIC    Imaging Review No results found.   EKG Interpretation None      MDM   Final diagnoses:  Nausea and vomiting, vomiting of unspecified type  Dehydration   4:23 PM Patient feels much better at this time.  His been hydrated in the emergency department.  Currently his discharge home with Zofran or Reglan.  He has never seen a gastroenterologist.  Have requested that he followup with the specialist for his ongoing symptoms.  He has also been informed that he would benefit from a primary care physician.  He  understands to return to the ER for new or worsening symptoms.   Lyanne CoKevin M Roslin Norwood, MD 12/30/13 [phone]

## 2013-12-30 NOTE — ED Provider Notes (Signed)
5:45 PM I was called patient's bedside as he vomited again prior to being discharged from here. He also complains of mild diffuse abdominal pain. Pain typical of cyclic vomiting syndrome he's had in the past described as diffuse and crampy. On exam alert nontoxic abdomen nondistended normal active bowel sounds mild diffuse tenderness Zofran and morphine ordered. 7:30 PM patient feels much improved and radial home after treatment with additional intravenous fluids, opioids and antiemetics. Results for orders placed during the hospital encounter of 12/30/13  CBC WITH DIFFERENTIAL      Result Value Ref Range   WBC 14.3 (*) 4.0 - 10.5 K/uL   RBC 5.15  4.22 - 5.81 MIL/uL   Hemoglobin 16.4  13.0 - 17.0 g/dL   HCT 47.846.4  29.539.0 - 62.152.0 %   MCV 90.1  78.0 - 100.0 fL   MCH 31.8  26.0 - 34.0 pg   MCHC 35.3  30.0 - 36.0 g/dL   RDW 30.812.9  65.711.5 - 84.615.5 %   Platelets 259  150 - 400 K/uL   Neutrophils Relative % 81 (*) 43 - 77 %   Neutro Abs 11.6 (*) 1.7 - 7.7 K/uL   Lymphocytes Relative 11 (*) 12 - 46 %   Lymphs Abs 1.6  0.7 - 4.0 K/uL   Monocytes Relative 8  3 - 12 %   Monocytes Absolute 1.1 (*) 0.1 - 1.0 K/uL   Eosinophils Relative 0  0 - 5 %   Eosinophils Absolute 0.0  0.0 - 0.7 K/uL   Basophils Relative 0  0 - 1 %   Basophils Absolute 0.0  0.0 - 0.1 K/uL  COMPREHENSIVE METABOLIC PANEL      Result Value Ref Range   Sodium 139  137 - 147 mEq/L   Potassium 3.4 (*) 3.7 - 5.3 mEq/L   Chloride 99  96 - 112 mEq/L   CO2 18 (*) 19 - 32 mEq/L   Glucose, Bld 153 (*) 70 - 99 mg/dL   BUN 13  6 - 23 mg/dL   Creatinine, Ser 9.620.91  0.50 - 1.35 mg/dL   Calcium 95.210.7 (*) 8.4 - 10.5 mg/dL   Total Protein 8.5 (*) 6.0 - 8.3 g/dL   Albumin 5.3 (*) 3.5 - 5.2 g/dL   AST 15  0 - 37 U/L   ALT 13  0 - 53 U/L   Alkaline Phosphatase 60  39 - 117 U/L   Total Bilirubin 1.2  0.3 - 1.2 mg/dL   GFR calc non Af Amer >90  >90 mL/min   GFR calc Af Amer >90  >90 mL/min   Anion gap 22 (*) 5 - 15  LIPASE, BLOOD      Result Value  Ref Range   Lipase 31  11 - 59 U/L  URINE RAPID DRUG SCREEN (HOSP PERFORMED)      Result Value Ref Range   Opiates POSITIVE (*) NONE DETECTED   Cocaine NONE DETECTED  NONE DETECTED   Benzodiazepines NONE DETECTED  NONE DETECTED   Amphetamines NONE DETECTED  NONE DETECTED   Tetrahydrocannabinol POSITIVE (*) NONE DETECTED   Barbiturates NONE DETECTED  NONE DETECTED  URINALYSIS, ROUTINE W REFLEX MICROSCOPIC      Result Value Ref Range   Color, Urine AMBER (*) YELLOW   APPearance CLOUDY (*) CLEAR   Specific Gravity, Urine 1.019  1.005 - 1.030   pH 7.5  5.0 - 8.0   Glucose, UA NEGATIVE  NEGATIVE mg/dL   Hgb urine dipstick NEGATIVE  NEGATIVE   Bilirubin Urine NEGATIVE  NEGATIVE   Ketones, ur >80 (*) NEGATIVE mg/dL   Protein, ur NEGATIVE  NEGATIVE mg/dL   Urobilinogen, UA 0.2  0.0 - 1.0 mg/dL   Nitrite NEGATIVE  NEGATIVE   Leukocytes, UA NEGATIVE  NEGATIVE  I-STAT CHEM 8, ED      Result Value Ref Range   Sodium 142  137 - 147 mEq/L   Potassium 3.7  3.7 - 5.3 mEq/L   Chloride 109  96 - 112 mEq/L   BUN 10  6 - 23 mg/dL   Creatinine, Ser 1.610.70  0.50 - 1.35 mg/dL   Glucose, Bld 096124 (*) 70 - 99 mg/dL   Calcium, Ion 0.451.10 (*) 1.12 - 1.23 mmol/L   TCO2 20  0 - 100 mmol/L   Hemoglobin 15.0  13.0 - 17.0 g/dL   HCT 40.944.0  81.139.0 - 91.452.0 %  I-STAT CG4 LACTIC ACID, ED      Result Value Ref Range   Lactic Acid, Venous 1.44  0.5 - 2.2 mmol/L   No results found.   Doug SouSam Yameli Delamater, MD 12/30/13 [phone]

## 2013-12-30 NOTE — ED Notes (Signed)
Patient is alert and oriented x3.  He was given DC instructions and follow up visit instructions.  Patient gave verbal understanding.  He was DC ambulatory under his own power to home.  V/S stable.  He was not showing any signs of distress on DC 

## 2013-12-30 NOTE — ED Notes (Signed)
Dr Shela CommonsJ notified that pt is still actively vomiting.

## 2013-12-30 NOTE — ED Notes (Signed)
He is vomiting very stridently and continuously in triage.  He c/o vomiting and central abd. Discomfort since yesterday.  He is somewhat pale and in no distress.

## 2013-12-30 NOTE — ED Notes (Signed)
MD at bedside. 

## 2014-01-02 ENCOUNTER — Encounter: Payer: Self-pay | Admitting: Gastroenterology

## 2014-01-24 ENCOUNTER — Emergency Department (HOSPITAL_COMMUNITY)
Admission: EM | Admit: 2014-01-24 | Discharge: 2014-01-24 | Disposition: A | Discharge status: Home / Self Care | Payer: Managed Care, Other (non HMO) | Attending: Emergency Medicine | Admitting: Emergency Medicine

## 2014-01-24 ENCOUNTER — Encounter (HOSPITAL_COMMUNITY): Payer: Self-pay | Admitting: Emergency Medicine

## 2014-01-24 DIAGNOSIS — Z046 Encounter for general psychiatric examination, requested by authority: Secondary | ICD-10-CM | POA: Diagnosis present

## 2014-01-24 DIAGNOSIS — F319 Bipolar disorder, unspecified: Secondary | ICD-10-CM | POA: Diagnosis not present

## 2014-01-24 DIAGNOSIS — Z8719 Personal history of other diseases of the digestive system: Secondary | ICD-10-CM | POA: Diagnosis not present

## 2014-01-24 DIAGNOSIS — Z720 Tobacco use: Secondary | ICD-10-CM | POA: Diagnosis not present

## 2014-01-24 DIAGNOSIS — G8929 Other chronic pain: Secondary | ICD-10-CM | POA: Diagnosis not present

## 2014-01-24 DIAGNOSIS — Z872 Personal history of diseases of the skin and subcutaneous tissue: Secondary | ICD-10-CM | POA: Insufficient documentation

## 2014-01-24 DIAGNOSIS — Z79899 Other long term (current) drug therapy: Secondary | ICD-10-CM | POA: Insufficient documentation

## 2014-01-24 LAB — CBC
HCT: 44 % (ref 39.0–52.0)
Hemoglobin: 15.2 g/dL (ref 13.0–17.0)
MCH: 31.6 pg (ref 26.0–34.0)
MCHC: 34.5 g/dL (ref 30.0–36.0)
MCV: 91.5 fL (ref 78.0–100.0)
Platelets: 208 10*3/uL (ref 150–400)
RBC: 4.81 MIL/uL (ref 4.22–5.81)
RDW: 13.1 % (ref 11.5–15.5)
WBC: 5.2 10*3/uL (ref 4.0–10.5)

## 2014-01-24 LAB — COMPREHENSIVE METABOLIC PANEL
ALT: 17 U/L (ref 0–53)
AST: 21 U/L (ref 0–37)
Albumin: 4.4 g/dL (ref 3.5–5.2)
Alkaline Phosphatase: 65 U/L (ref 39–117)
Anion gap: 15 (ref 5–15)
BILIRUBIN TOTAL: 0.4 mg/dL (ref 0.3–1.2)
BUN: 10 mg/dL (ref 6–23)
CHLORIDE: 105 meq/L (ref 96–112)
CO2: 24 meq/L (ref 19–32)
Calcium: 9.3 mg/dL (ref 8.4–10.5)
Creatinine, Ser: 0.73 mg/dL (ref 0.50–1.35)
GLUCOSE: 104 mg/dL — AB (ref 70–99)
Potassium: 4.2 mEq/L (ref 3.7–5.3)
SODIUM: 144 meq/L (ref 137–147)
Total Protein: 7.6 g/dL (ref 6.0–8.3)

## 2014-01-24 LAB — SALICYLATE LEVEL

## 2014-01-24 LAB — RAPID URINE DRUG SCREEN, HOSP PERFORMED
AMPHETAMINES: NOT DETECTED
BARBITURATES: NOT DETECTED
BENZODIAZEPINES: NOT DETECTED
Cocaine: NOT DETECTED
Opiates: NOT DETECTED
TETRAHYDROCANNABINOL: POSITIVE — AB

## 2014-01-24 LAB — ACETAMINOPHEN LEVEL: Acetaminophen (Tylenol), Serum: <15 ug/mL (ref 10–30)

## 2014-01-24 LAB — ETHANOL: Alcohol, Ethyl (B): 180 mg/dL — ABNORMAL HIGH (ref 0–11)

## 2014-01-24 MED ORDER — LORAZEPAM 1 MG PO TABS: 1.0000 mg | ORAL_TABLET | Freq: Three times a day (TID) | ORAL | Status: DC | PRN

## 2014-01-24 MED ORDER — ZIPRASIDONE MESYLATE 20 MG IM SOLR: 10.0000 mg | INTRAMUSCULAR | Status: DC | PRN

## 2014-01-24 MED ORDER — ZOLPIDEM TARTRATE 5 MG PO TABS: 5.0000 mg | ORAL_TABLET | Freq: Every evening | ORAL | Status: DC | PRN

## 2014-01-24 MED ORDER — NICOTINE 21 MG/24HR TD PT24: 21.0000 mg | MEDICATED_PATCH | Freq: Every day | TRANSDERMAL | Status: DC

## 2014-01-24 MED ORDER — ALUM & MAG HYDROXIDE-SIMETH 200-200-20 MG/5ML PO SUSP: 30.0000 mL | ORAL | Status: DC | PRN

## 2014-01-24 MED ORDER — IBUPROFEN 200 MG PO TABS: 600.0000 mg | ORAL_TABLET | Freq: Three times a day (TID) | ORAL | Status: DC | PRN

## 2014-01-24 MED ORDER — ONDANSETRON HCL 4 MG PO TABS: 4.0000 mg | ORAL_TABLET | Freq: Three times a day (TID) | ORAL | Status: DC | PRN

## 2014-01-24 MED ORDER — ACETAMINOPHEN 325 MG PO TABS: 650.0000 mg | ORAL_TABLET | ORAL | Status: DC | PRN

## 2014-01-24 NOTE — ED Notes (Signed)
Pt threw up all over the chair and all over himself.  Writer helped pt clean himself up. Pt then requested for something to eat and some ice chips.

## 2014-01-24 NOTE — ED Notes (Signed)
Pt has in belonging bag:  One black iphone, one white iphone, cigs box, black and purple charger, black ear buds, two black t-shirt, green t-shirt, white long sleeve thermal shirt, blue hat, 3 pj pants, purple shirt, orange t-shirt, two boxers, one pair of black shirt, one green shoe, one blue shoe, 4 keys with key ring, Red picture frame with "Billy's girl", two medicine bottles.

## 2014-01-24 NOTE — ED Notes (Signed)
Patient states he feels very anxious. Patient is drowsy and having difficulty staying awake. Patient vomited x1, states he believes he got "worked up" and it made him sick.

## 2014-01-24 NOTE — ED Notes (Addendum)
Pt stumbled while getting into wheelchair. Pt repots he took his sleeping medicine at 0200 and 2 shots and 6 beers from 2000-0230 and "think it just hit him". Pt is talking in full sentences, but acting drowsy.

## 2014-01-24 NOTE — ED Notes (Signed)
Pt belongings in locker 33, 2 belonging bags, 1 book bag

## 2014-01-24 NOTE — ED Notes (Signed)
Pt called RN to room wanting to know the delay.  States he was told by MD that he would be fast tracked in to the psych unit when he came here.  Explained to pt that he was waiting to be cleared by medical provider to determine next step.  Pt appears groggy but alert and oriented.

## 2014-01-24 NOTE — ED Provider Notes (Signed)
CSN: [id number]     Arrival date & time 01/24/14  0603 History   First MD Initiated Contact with Patient 01/24/14 0747     Chief Complaint  Patient presents with  . Psychiatric Evaluation     (Consider location/radiation/quality/duration/timing/severity/associated sxs/prior Treatment) HPI Comments: Patient is a 28 year old male with history of bipolar disorder, chronic abdominal pain who presents the emergency department for psychiatric evaluation. Patient reports that he has been having an increasingly difficult time and been under more stress. He has had worsening panic attacks. He denies any suicidal or homicidal ideation. Patient is somnolent on my interview and frequently falls back asleep while talking. History is limited for this reason.  The history is provided by the patient. No language interpreter was used.    Past Medical History  Diagnosis Date  . Bipolar 1 disorder   . GI bleed   . H/O endoscopy   . Ulcer    History reviewed. No pertinent past surgical history. History reviewed. No pertinent family history. History  Substance Use Topics  . Smoking status: Current Every Day Smoker -- 2.00 packs/day    Types: Cigarettes  . Smokeless tobacco: Never Used  . Alcohol Use: 3.0 oz/week    5 Cans of beer per week     Comment: occ    Review of Systems  Unable to perform ROS: Psychiatric disorder      Allergies  Review of patient's allergies indicates no known allergies.  Home Medications   Prior to Admission medications   Medication Sig Start Date End Date Taking? Authorizing Provider  estazolam (PROSOM) 2 MG tablet Take 2 mg by mouth at bedtime.   Yes Historical Provider, MD  lamoTRIgine (LAMICTAL) 200 MG tablet Take 200 mg by mouth daily.   Yes Historical Provider, MD  lamoTRIgine (LAMICTAL) 100 MG tablet Take 100 mg by mouth every morning.     Historical Provider, MD  metoCLOPramide (REGLAN) 10 MG tablet Take 1 tablet (10 mg total) by mouth every 8 (eight)  hours as needed for nausea or vomiting. 12/30/13   Preston CoKevin M Campos, MD  omeprazole (PRILOSEC) 20 MG capsule Take 20 mg by mouth every morning.    Historical Provider, MD  ondansetron (ZOFRAN ODT) 8 MG disintegrating tablet Take 1 tablet (8 mg total) by mouth every 8 (eight) hours as needed for nausea or vomiting. 12/30/13   Preston CoKevin M Campos, MD  promethazine (PHENERGAN) 25 MG tablet Take 25 mg by mouth every 6 (six) hours as needed for nausea or vomiting.    Historical Provider, MD   BP 117/64 mmHg  Pulse 80  Temp(Src) 97.3 F (36.3 C) (Oral)  Resp 16  SpO2 98% Physical Exam  Constitutional: He appears well-developed and well-nourished. No distress.  HENT:  Head: Normocephalic and atraumatic.  Right Ear: External ear normal.  Left Ear: External ear normal.  Nose: Nose normal.  Eyes: Conjunctivae are normal. Pupils are equal, round, and reactive to light.  Pupils dilated, but responsive  Neck: Normal range of motion. No tracheal deviation present.  Cardiovascular: Normal rate, regular rhythm and normal heart sounds.   Pulmonary/Chest: Effort normal and breath sounds normal. No stridor.  Abdominal: Soft. He exhibits no distension. There is no tenderness.  Musculoskeletal: Normal range of motion.  Neurological: He is alert.  Skin: Skin is warm and dry. He is not diaphoretic.  Psychiatric: He has a normal mood and affect. His behavior is normal.  Nursing note and vitals reviewed.   ED Course  Procedures (  including critical care time) Labs Review Labs Reviewed  COMPREHENSIVE METABOLIC PANEL - Abnormal; Notable for the following:    Glucose, Bld 104 (*)    All other components within normal limits  ETHANOL - Abnormal; Notable for the following:    Alcohol, Ethyl (B) 180 (*)    All other components within normal limits  SALICYLATE LEVEL - Abnormal; Notable for the following:    Salicylate Lvl <2.0 (*)    All other components within normal limits  URINE RAPID DRUG SCREEN (HOSP  PERFORMED) - Abnormal; Notable for the following:    Tetrahydrocannabinol POSITIVE (*)    All other components within normal limits  ACETAMINOPHEN LEVEL  CBC    Imaging Review No results found.   EKG Interpretation None      MDM   Final diagnoses:  Bipolar 1 disorder   Patient presents to emergency department for psychiatric evaluation. On my initial exam he was very lethargic and responsive only to painful stimuli. Patient was put on a cardiac monitor. Patient awoke and reports that he feels significantly improved. He reports that he took a sleeping pill and believes it "hit him all at once". He would like to go home. During his entire stay he has denied suicidal or homicidal ideation. He has a psychiatrist in plans to call them upon discharge. I discussed reasons to return to emergency Department immediately. Vital signs stable for discharge. Patient / Family / Caregiver informed of clinical course, understand medical decision-making process, and agree with plan.     Preston BellmanHannah S Moses Odoherty, PA-C 01/25/14 1607  Preston Munchobert Lockwood, MD 01/26/14 [phone]

## 2014-01-24 NOTE — ED Notes (Signed)
Patient states he is coming in for psychiatric evaluation. Patient states he is BiPolar2, and is having a hard time. Patient reports a hx of being IVC. 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 Patient speaking slowly. Patient denies SI/HI. Patient reports bouts of irrational anger as well.

## 2014-01-24 NOTE — Discharge Instructions (Signed)
Bipolar Disorder °Bipolar disorder is a mental illness. The term bipolar disorder actually is used to describe a group of disorders that all share varying degrees of emotional highs and lows that can interfere with daily functioning, such as work, school, or relationships. Bipolar disorder also can lead to drug abuse, hospitalization, and suicide. °The emotional highs of bipolar disorder are periods of elation or irritability and high energy. These highs can range from a mild form (hypomania) to a severe form (mania). People experiencing episodes of hypomania may appear energetic, excitable, and highly productive. People experiencing mania may behave impulsively or erratically. They often make poor decisions. They may have difficulty sleeping. The most severe episodes of mania can involve having very distorted beliefs or perceptions about the world and seeing or hearing things that are not real (psychotic delusions and hallucinations).  °The emotional lows of bipolar disorder (depression) also can range from mild to severe. Severe episodes of bipolar depression can involve psychotic delusions and hallucinations. °Sometimes people with bipolar disorder experience a state of mixed mood. Symptoms of hypomania or mania and depression are both present during this mixed-mood episode. °SIGNS AND SYMPTOMS °There are signs and symptoms of the episodes of hypomania and mania as well as the episodes of depression. The signs and symptoms of hypomania and mania are similar but vary in severity. They include: °· Inflated self-esteem or feeling of increased self-confidence. °· Decreased need for sleep. °· Unusual talkativeness (rapid or pressured speech) or the feeling of a need to keep talking. °· Sensation of racing thoughts or constant talking, with quick shifts between topics that may or may not be related (flight of ideas). °· Decreased ability to focus or concentrate. °· Increased purposeful activity, such as work, studies,  or social activity, or nonproductive activity, such as pacing, squirming and fidgeting, or finger and toe tapping. °· Impulsive behavior and use of poor judgment, resulting in high-risk activities, such as having unprotected sex or spending excessive amounts of money. °Signs and symptoms of depression include the following:  °· Feelings of sadness, hopelessness, or helplessness. °· Frequent or uncontrollable episodes of crying. °· Lack of feeling anything or caring about anything. °· Difficulty sleeping or sleeping too much.  °· Inability to enjoy the things you used to enjoy.   °· Desire to be alone all the time.   °· Feelings of guilt or worthlessness.  °· Lack of energy or motivation.   °· Difficulty concentrating, remembering, or making decisions.  °· Change in appetite or weight beyond normal fluctuations. °· Thoughts of death or the desire to harm yourself. °DIAGNOSIS  °Bipolar disorder is diagnosed through an assessment by your caregiver. Your caregiver will ask questions about your emotional episodes. There are two main types of bipolar disorder. People with type I bipolar disorder have manic episodes with or without depressive episodes. People with type II bipolar disorder have hypomanic episodes and major depressive episodes, which are more serious than mild depression. The type of bipolar disorder you have can make an important difference in how your illness is monitored and treated. °Your caregiver may ask questions about your medical history and use of alcohol or drugs, including prescription medication. Certain medical conditions and substances also can cause emotional highs and lows that resemble bipolar disorder (secondary bipolar disorder).  °TREATMENT  °Bipolar disorder is a long-term illness. It is best controlled with continuous treatment rather than treatment only when symptoms occur. The following treatments can be prescribed for bipolar disorders: °· Medication--Medication can be prescribed by  a doctor that   is an expert in treating mental disorders (psychiatrists). Medications called mood stabilizers are usually prescribed to help control the illness. Other medications are sometimes added if symptoms of mania, depression, or psychotic delusions and hallucinations occur despite the use of a mood stabilizer.  Talk therapy--Some forms of talk therapy are helpful in providing support, education, and guidance. A combination of medication and talk therapy is best for managing the disorder over time. A procedure in which electricity is applied to your brain through your scalp (electroconvulsive therapy) is used in cases of severe mania when medication and talk therapy do not work or work too slowly. Document Released: 06/15/2000 Document Revised: 07/04/2012 Document Reviewed: 04/04/2012 [facility] Patient Information 2015 [place], [place]. This information is not intended to replace advice given to you by your health care provider. Make sure you discuss any questions you have with your health care provider.   Emergency Department Resource Guide 1) Find a Doctor and Pay Out of Pocket Although you won't have to find out who is covered by your insurance plan, it is a good idea to ask around and get recommendations. You will then need to call the office and see if the doctor you have chosen will accept you as a new patient and what types of options they offer for patients who are self-pay. Some doctors offer discounts or will set up payment plans for their patients who do not have insurance, but you will need to ask so you aren't surprised when you get to your appointment.  2) Contact Your Local Health Department Not all health departments have doctors that can see patients for sick visits, but many do, so it is worth a call to see if yours does. If you don't know where your local health department is, you can check in your phone book. The CDC also has a tool to help you locate your state's health  department, and many state websites also have listings of all of their local health departments.  3) Find a Walk-in Clinic If your illness is not likely to be very severe or complicated, you may want to try a walk in clinic. These are popping up all over the country in pharmacies, drugstores, and shopping centers. They're usually staffed by nurse practitioners or physician assistants that have been trained to treat common illnesses and complaints. They're usually fairly quick and inexpensive. However, if you have serious medical issues or chronic medical problems, these are probably not your best option.  No Primary Care Doctor: - Call Health Connect at  [phone] - they can help you locate a primary care doctor that  accepts your insurance, provides certain services, etc. - Physician Referral Service- [phone]  Chronic Pain Problems: Organization         Address  Phone   Notes  [facility]  [phone] Patients need to be referred by their primary care doctor.   Medication Assistance: Organization         Address  Phone   Notes  [facility] Medication [facility] [address] [place] [place], [place] [address] [phone] --Must be a resident of [facility] -- Must have NO insurance coverage whatsoever (no Medicaid/ Medicare, etc.) -- The pt. MUST have a primary care doctor that directs their care regularly and follows them in the community   [facility]ist  [phone]   [employer]  [phone]    Agencies that provide inexpensive medical care: Organization  Address  Phone   Notes  [facility]  [phone]) [phone]   [facility]    [phone]   [facility] [address] [place], [place] [address] [phone]   [facility] [place] [address], [place] [phone]   [facility]anned Parenthood    [phone]) [phone]   [facility]    [phone]    [facility] and [facility]  [address], [place] Phone:  [phone], Fax:  [phone] Hours of Operation:  9 am - 6 pm, M-F.  Also accepts Medicaid/Medicare and self-pay.  [facility]  [address], [place] Phone: [phone], Fax: [phone]) [phone]. Hours of Operation:  8:30 am - 5:30 pm, M-F.  Also accepts Medicaid and self-pay.  [facility] [place] [address], [place] Phone: [phone]   [facility] [address], [place], [place] [phone], Ext. [phone] Mondays & Thursdays: 7-9 AM.  First 15 patients are seen on a first come, first serve basis.    Medicaid-accepting [facility] Providers:  Organization         Address  Phone   Notes  [facility] [address], [place] [phone] Also accepts self-pay patients.  [facility] [address], [place]  [phone]) [phone]   [facility] [address], [address], [place] [phone]   [facility] [address] [place] [address], [place] [phone]) [phone]   Renaye RakersVeita Bland [address], [place]   [phone] Only accepts [place] Access [place] patients after they have their name applied to their card.   Self-Pay (no insurance) in [facility] [place]:  Organization         Address  Phone   Notes  Sickle Cell Patients, [facility] [address], [place] [phone]   [facility] [address], [place] [phone]   [facility] [place]  [address], [place] [phone]   [facility]/Dr. Osei-Bonsu  [address] [place] [address], [place] or [address], [place] [phone] Phone number for both [place] and [place] locations is the same.  [facility] [address], [place] [phone]) [phone]    [facility] [place] [address] [place] [address], [place] or [address] [phone]) [phone]) [phone]   [facility] [address], [place] [phone], phone; [phone], fax Sees patients 1st and 3rd Saturday of every month.  Must not qualify for public or private insurance (i.e. Medicaid, Medicare, [place] Health Choice, Veterans' Benefits)  Household income should be no more than 200% of the poverty level The clinic cannot treat you if you are pregnant or think you are pregnant  Sexually transmitted diseases are not treated at the clinic.    Dental Care: Organization         Address  Phone  Notes  [facility] Department of [facility] [address], [place] [phone] Accepts children up to age 27 who are enrolled in [place]icaid or [place] Health Choice; pregnant women with a Medicaid card; and children who have applied for Medicaid or [place] Health Choice, but were declined, whose parents can pay a reduced fee at time  of service.  [facility] Department of [facility] [place]  [address], [place] [phone] Accepts children up to age 27 who are enrolled in [place]icaid or [place] Health Choice; pregnant women with a Medicaid card; and children who have applied for Medicaid or [place] Health Choice, but were declined, whose parents can pay a reduced fee at time of service.  [facility]  [address], [place] [phone] Patients are seen by appointment only. Walk-ins are not accepted. [facility] will see patients 27 years of age and older. Monday - Tuesday (8am-5pm) Most Wednesdays (8:30-5pm) $30 per visit, cash only  [facility]  [address], [facility] [phone] Patients are seen by appointment only. Walk-ins are not accepted. [facility] will see patients 27 years of age and older. One Wednesday Evening (Monthly: Volunteer Based).   $30 per visit, cash only  [employer] [facility]  [phone] for adults; Children under age 744, call Graduate Pediatric Dentistry at [phone]. Children aged 114-14, please call [phone] to request a pediatric application.  Dental services are provided in all areas of dental care including fillings, crowns and bridges, complete and partial dentures, implants, gum treatment, root canals, and extractions. Preventive care is also provided. Treatment is provided to both adults and children. Patients are selected via a lottery and there is often a waiting list.    [facility] [address], [place]  [phone] [email or web address]   [facility] [address], [place], [place] [phone], Ext. [phone] Second and Fourth Thursday of each month, opens at 6:30 AM; Clinic ends at 9 AM.  Patients are seen on a first-come first-served basis, and a limited number are seen during each clinic.   [facility]  [address], [place], [place] [phone]) [phone]   Eligibility Requirements You must have lived in [place], [place], or [place] counties for at least the last three months.   You cannot be eligible for state or federal sponsored National Cityhealthcare insurance, including CIGNAVeterans Administration, [place]icaid, or [employer] EntertainmentMedicare.   You generally cannot be eligible for healthcare insurance through your employer.    How to apply: Eligibility screenings are held every Tuesday and Wednesday afternoon from 1:00 pm until 4:00 pm. You do not need an appointment for the interview!  [facility] [address], [place], [place] [phone]   [facility]  [phone]   [facility]  [phone]   [facility]  [phone]    Behavioral Health Resources in the Community: Intensive Outpatient Programs Organization         Address  Phone  Notes  [facility] [address], [place], [place] [phone]   [facility] [address], [place], [place] [phone]   [facility] [address], [place], [place]  [phone]   [facility] [address],  [place], [place] [phone] or [phone]   Substance Abuse Resources Organization         Address  Phone  Notes  [facility]  [phone]   [facility]  [phone]   [facility]  [phone]   [facility]  [phone]   Residential & Outpatient Substance Abuse Program  [phone]   Psychological Services Organization         Address  Phone  Notes  [facility]  [phone]- [phone]   [facility]  [phone]- [phone]   [facility] [address], [place] [phone] or [phone]    Mobile Crisis Teams Organization         Address  Phone  Notes  [facility], Mobile Crisis Care Unit  [phone]   [facility]  [address] [place], [place] [phone]   Doristine LocksSharon DeEsch [address] [place] [phone]    Self-Help/Support Groups Organization         Address  Phone             Notes  [facility] [place] - variety of support groups  336- [phone] Call for more information  Narcotics Anonymous (NA), [facility] [address], [place]  2 meetings at this location   Statisticianesidential Treatment Programs Organization         Address  Phone  Notes  [facility] [address],    [place]  [phone]   [facility]  [address], [place] [id number], [place], [place] [phone]   [facility] [address], [place] [phone] Admissions: 8am-3pm M-F  [facility] [address]    [place], [place] [phone]   [facility] [address], [place], [place] [phone]   [facility] [address]  [place], [place] [phone]   [facility] - Intensive Outpatient [address], [place], [place] [phone]   [facility] (Addiction Recovery [facility] [address]  [place], [place] [phone] or [phone]   [facility] (RTS) [address] [place], [place] [phone] Accepts Medicaid  [facility] [address]  [place] [phone] Substance Abuse/Addiction Treatment   [facility] Behavioral Health Resources Organization         Address  Phone  Notes  [facility]  [phone]   Angie FavaJulie Brannon, PhD [address], [address] [place], [place]   [phone](336) [phone] or [phone]   [facility]   [address] [place], [place] [phone](336) [phone]   [facility] [address], [place], [place] [phone]) [phone] Insurance/Medicaid/sponsorship through [facility]  [facility]ith [facility] Families [address]                                    [place], [place] [phone]) [phone] Therapy/tele-psych/case  [facility] [address].   [place], [place] [phone](336) [phone]    Dr. Lolly MustacheArfeen  [phone](336) [phone]   [facility]  [employer] [facility] 1) [address], [place] 2) [address], [place] 3)  [address], [place] [phone]) [phone]  [phone]   [facility] Child Abuse Hotline [phone]) [phone] or [phone](336) [phone] (After Hours)

## 2014-03-02 ENCOUNTER — Emergency Department (HOSPITAL_COMMUNITY)
Admission: EM | Admit: 2014-03-02 | Discharge: 2014-03-02 | Disposition: A | Discharge status: Home / Self Care | Payer: Managed Care, Other (non HMO) | Attending: Emergency Medicine | Admitting: Emergency Medicine

## 2014-03-02 ENCOUNTER — Encounter (HOSPITAL_COMMUNITY): Payer: Self-pay | Admitting: Emergency Medicine

## 2014-03-02 DIAGNOSIS — S3992XA Unspecified injury of lower back, initial encounter: Secondary | ICD-10-CM | POA: Diagnosis not present

## 2014-03-02 DIAGNOSIS — Z79899 Other long term (current) drug therapy: Secondary | ICD-10-CM | POA: Diagnosis not present

## 2014-03-02 DIAGNOSIS — Y998 Other external cause status: Secondary | ICD-10-CM | POA: Diagnosis not present

## 2014-03-02 DIAGNOSIS — Z8719 Personal history of other diseases of the digestive system: Secondary | ICD-10-CM | POA: Diagnosis not present

## 2014-03-02 DIAGNOSIS — Y9389 Activity, other specified: Secondary | ICD-10-CM | POA: Diagnosis not present

## 2014-03-02 DIAGNOSIS — Z720 Tobacco use: Secondary | ICD-10-CM | POA: Diagnosis not present

## 2014-03-02 DIAGNOSIS — Z9889 Other specified postprocedural states: Secondary | ICD-10-CM | POA: Insufficient documentation

## 2014-03-02 DIAGNOSIS — F319 Bipolar disorder, unspecified: Secondary | ICD-10-CM | POA: Insufficient documentation

## 2014-03-02 DIAGNOSIS — Y92410 Unspecified street and highway as the place of occurrence of the external cause: Secondary | ICD-10-CM | POA: Diagnosis not present

## 2014-03-02 MED ORDER — ONDANSETRON 4 MG PO TBDP
4.0000 mg | ORAL_TABLET | Freq: Once | ORAL | Status: AC
  Administered 2014-03-02: 4 mg via ORAL
  Filled 2014-03-02: qty 1

## 2014-03-02 MED ORDER — HYDROCODONE-ACETAMINOPHEN 5-325 MG PO TABS
1.0000 | ORAL_TABLET | Freq: Once | ORAL | Status: AC
  Administered 2014-03-02: 1 via ORAL
  Filled 2014-03-02: qty 1

## 2014-03-02 MED ORDER — TRAMADOL HCL 50 MG PO TABS: 50.0000 mg | ORAL_TABLET | Freq: Four times a day (QID) | ORAL | Status: DC | PRN

## 2014-03-02 MED ORDER — CYCLOBENZAPRINE HCL 10 MG PO TABS: 10.0000 mg | ORAL_TABLET | Freq: Two times a day (BID) | ORAL | Status: DC | PRN
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
## 2014-03-02 NOTE — Discharge Instructions (Signed)

## 2014-03-06 ENCOUNTER — Ambulatory Visit: Payer: Managed Care, Other (non HMO) | Admitting: Gastroenterology

## 2014-08-25 IMAGING — US US ABDOMEN LIMITED
1 series · 14 of 25 positions shown · non-contrast
Comparison: CT of the abdomen and pelvis October 06, 2013 at 1126 hr

CLINICAL DATA: Followup CT abnormality.

EXAM:
US ABDOMEN LIMITED - RIGHT UPPER QUADRANT

[Series 1: us abdomen limited · 0.21mm/px · 14 of 48 slices shown]
[im 1/48]
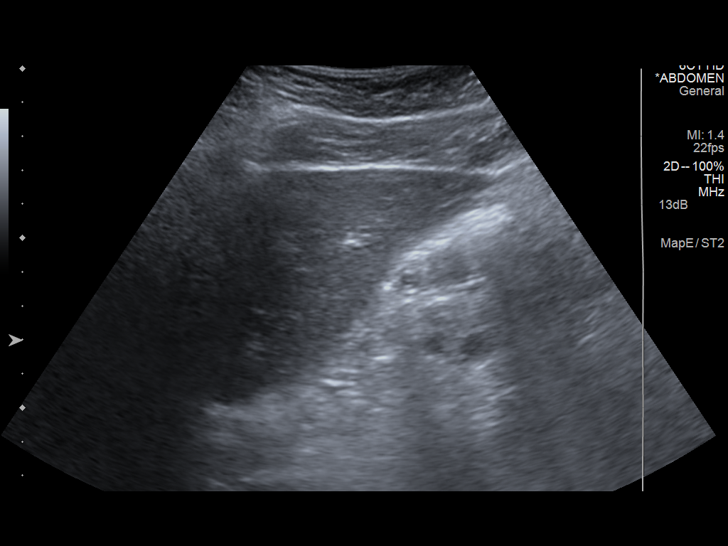
[im 4/48]
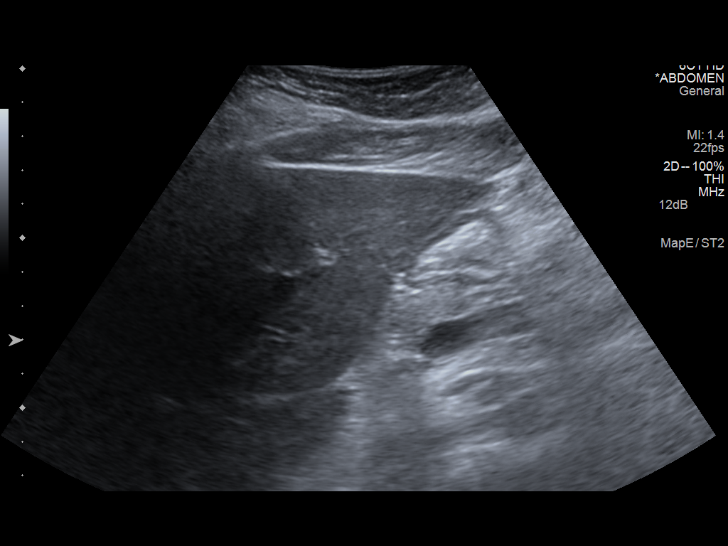
[im 8/48]
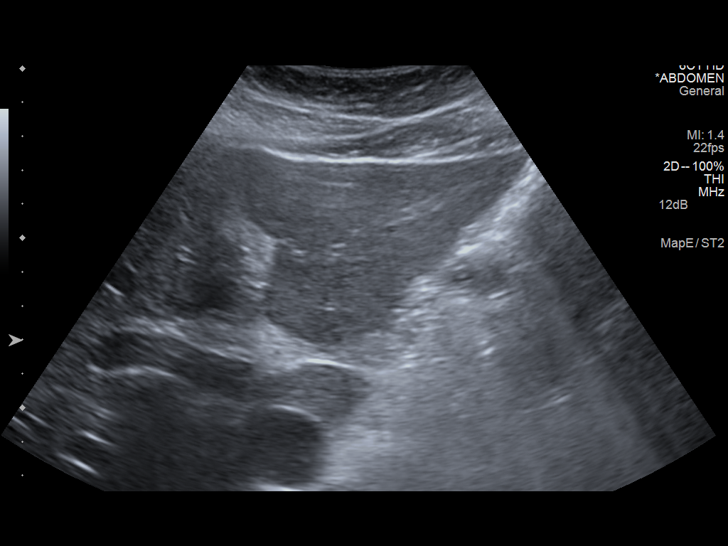
[im 12/48]
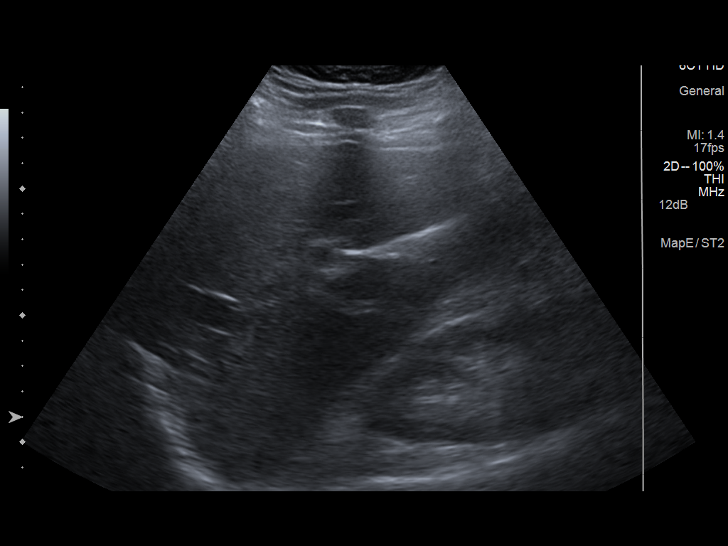
[im 16/48]
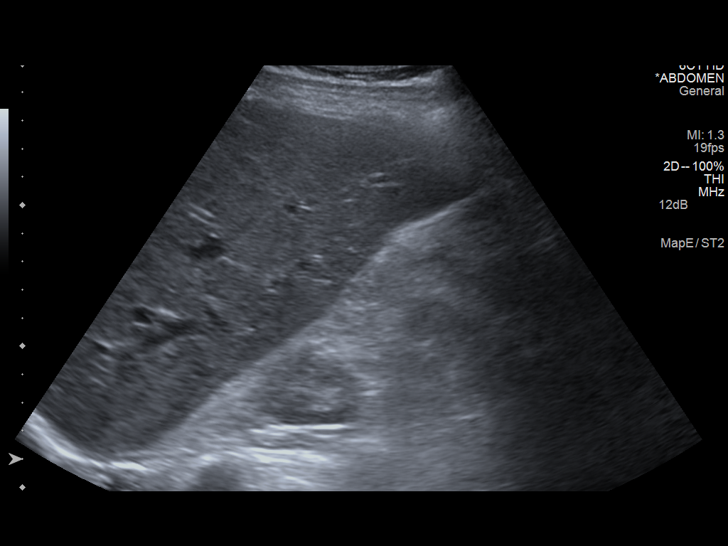
[im 18/48]
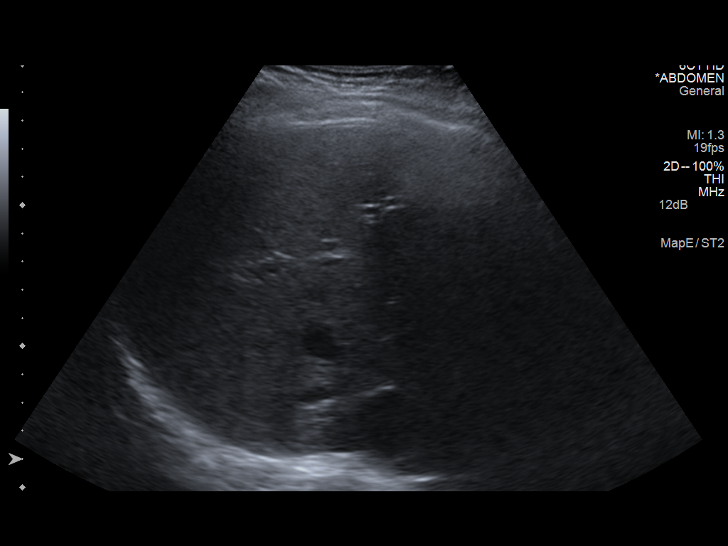
[im 22/48]
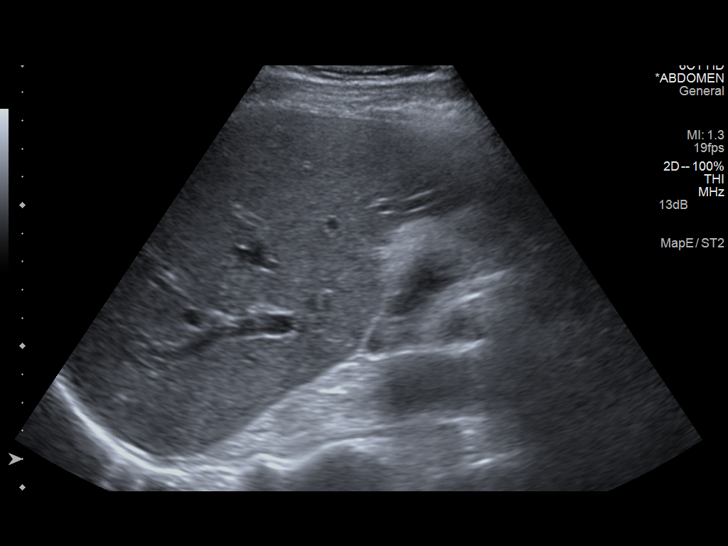
[im 26/48]
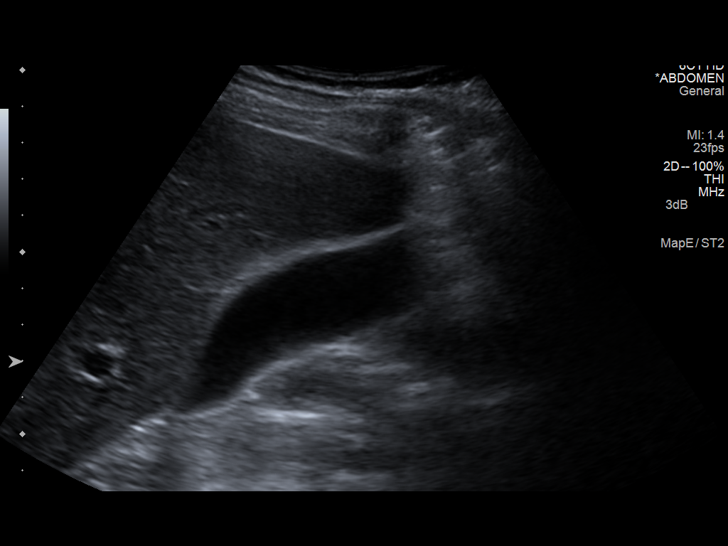
[im 30/48]
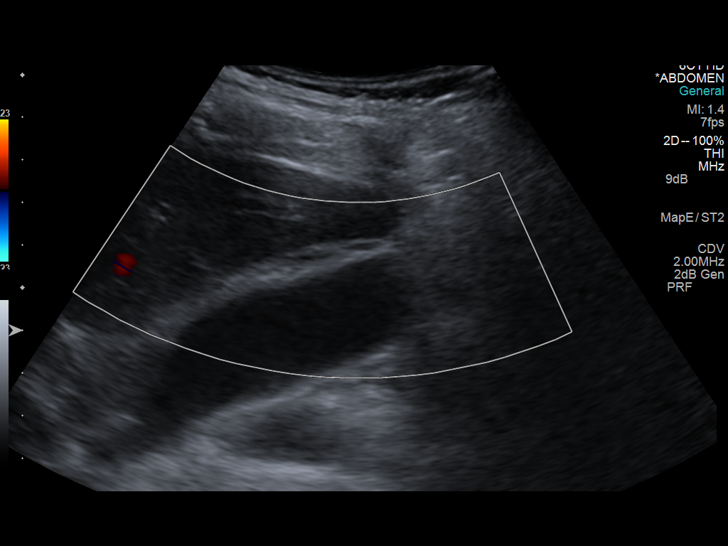
[im 32/48]
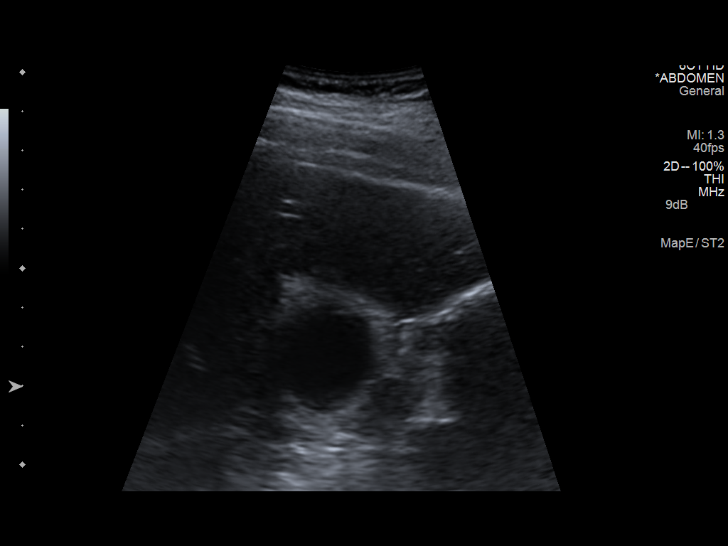
[im 36/48]
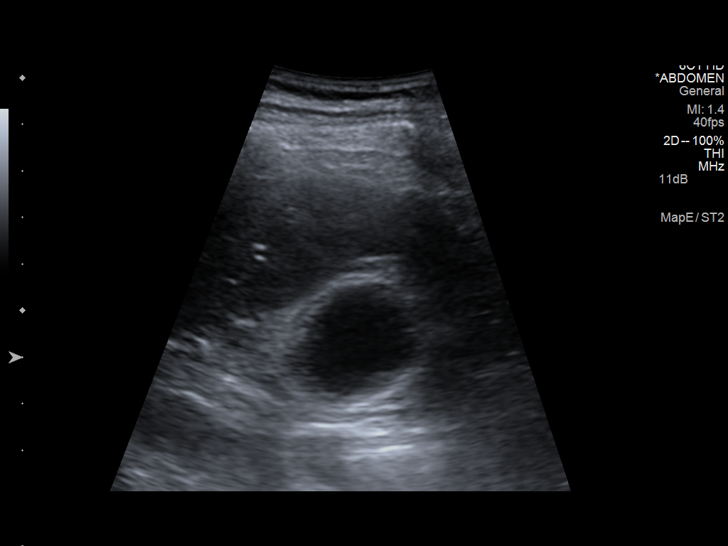
[im 40/48]
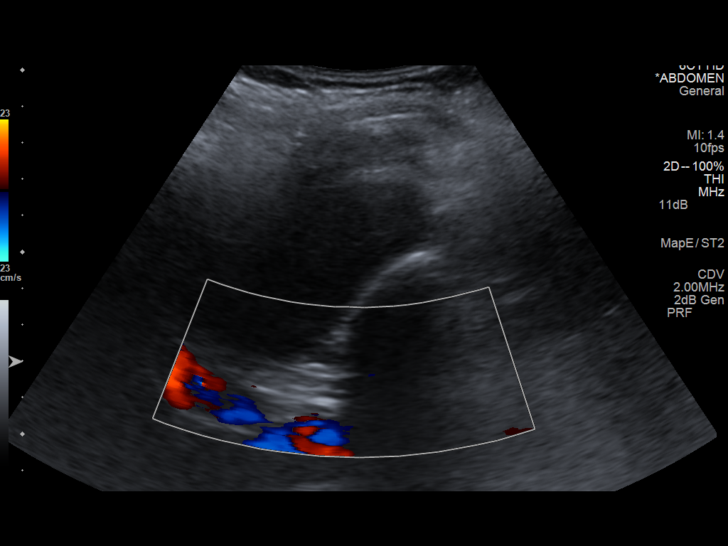
[im 44/48]
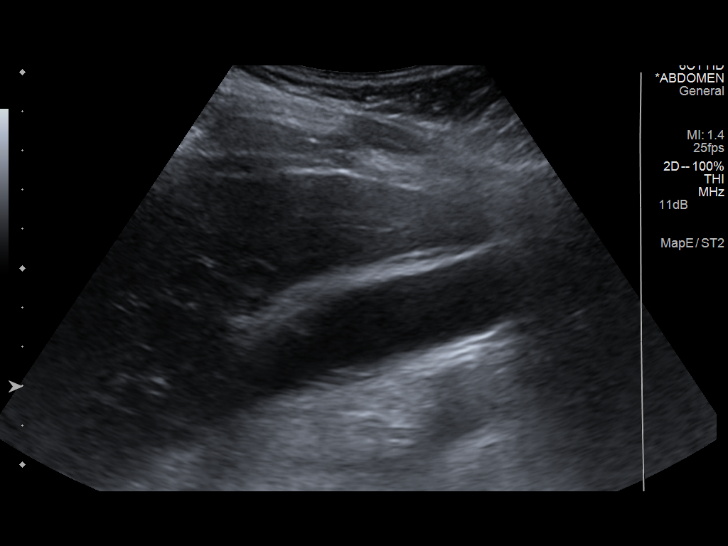
[im 48/48]
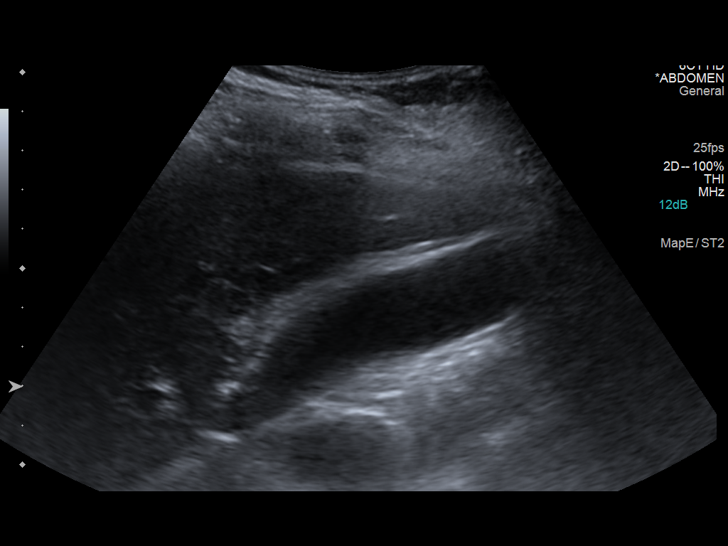

[14 of 25 positions shown; findings below may reference images not displayed]

FINDINGS: Gallbladder:

Gallbladder wall thickening to 7 mm with trace pericholecystic
fluid. No sonographic findings of cholelithiasis. Limited assessment
for Murphy's sign as patient is on pain medication.

Common bile duct:

Diameter: 4 mm

Liver:

No focal lesion identified. Within normal limits in parenchymal
echogenicity. Hepatopetal portal vein.
IMPRESSION: Gallbladder wall thickening with trace pericholecystic fluid, could
reflect acalculus cholecystitis, limited assessment for sonographic
Murphy sign as patient is on pain medication. Findings of likely
reflect sequelae of portal hypertension.

  By: Roibeard Maghiar

## 2014-08-25 IMAGING — CT CT ABD-PELV W/ CM
1 of 2 series · 15 of 32 positions shown, 19 images · IV contrast (100 ML OMNI 300)
Comparison: July 04, 2013

CLINICAL DATA: Elevated white blood cell count; nausea, vomiting,
and diarrhea

EXAM:
CT ABDOMEN AND PELVIS WITH CONTRAST
TECHNIQUE: Multidetector CT imaging of the abdomen and pelvis was performed
using the standard protocol following bolus administration of
intravenous contrast. Oral contrast was also administered.
CONTRAST:  100mL OMNIPAQUE IOHEXOL 300 MG/ML  SOLN

[Series 2: abd/pel with · axial · 0.70mm/px · z∈[-374,+41]mm · 15 of 91 slices shown, 19 images]
[im 4/91  soft-tissue]
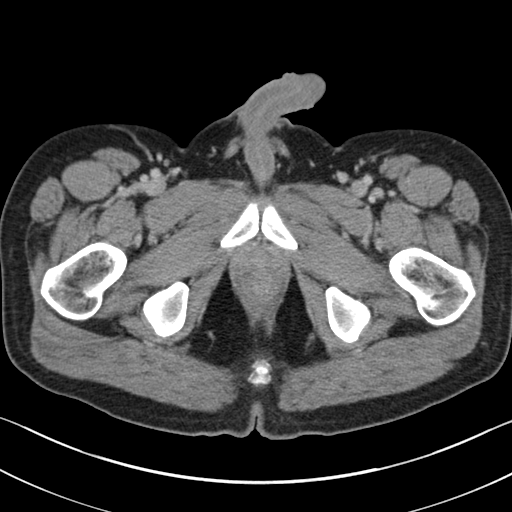
[im 4/91  bone]
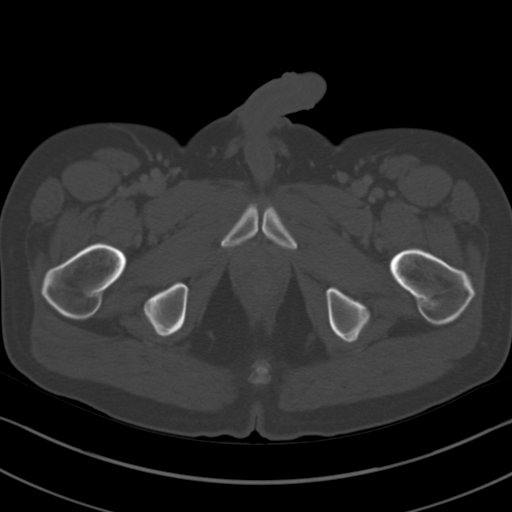
[im 11/91  soft-tissue]
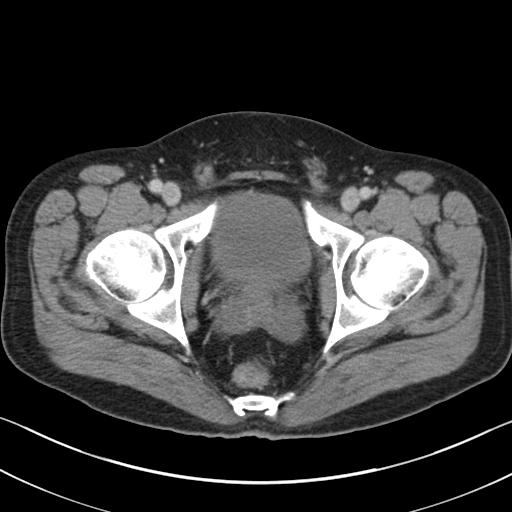
[im 19/91  soft-tissue]
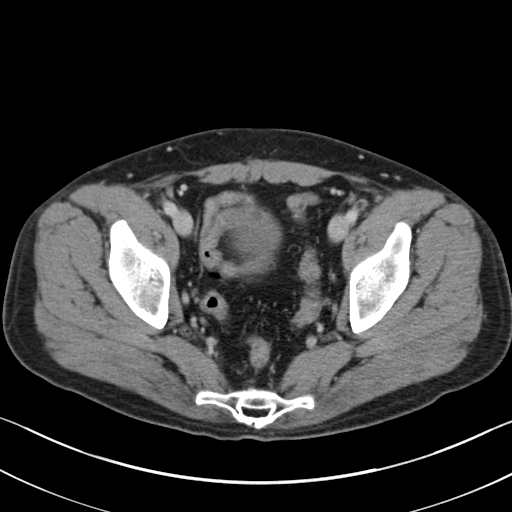
[im 26/91  soft-tissue]
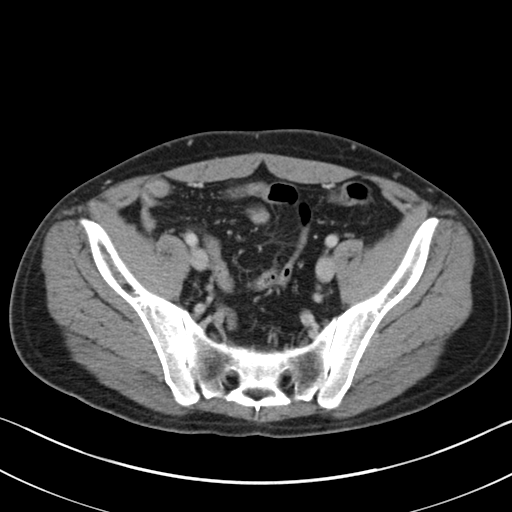
[im 33/91  soft-tissue]
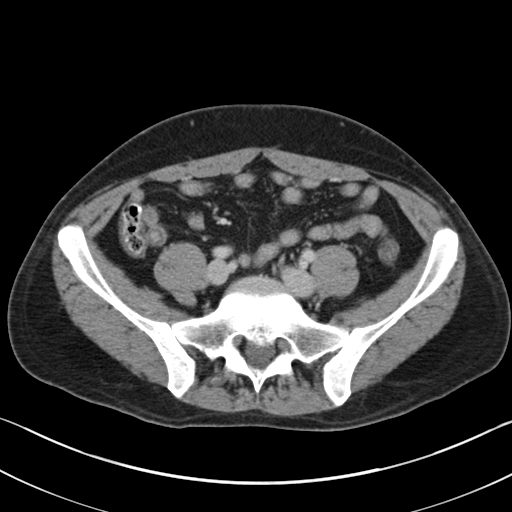
[im 40/91  soft-tissue]
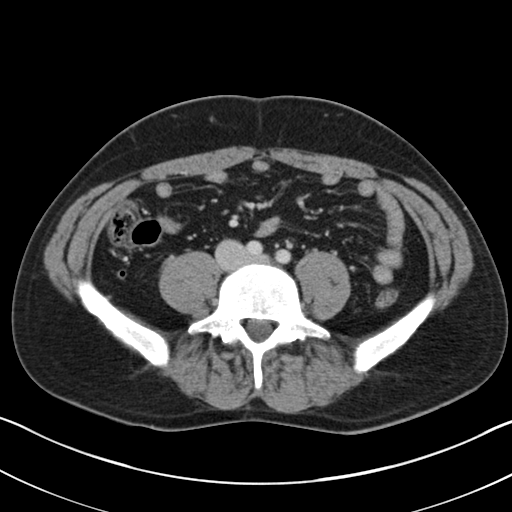
[im 47/91  soft-tissue]
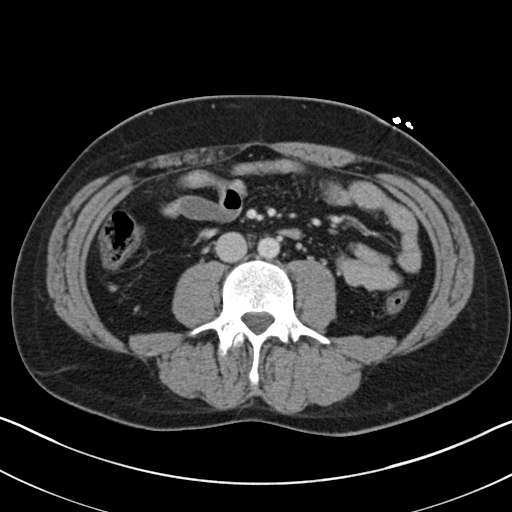
[im 51/91  soft-tissue]
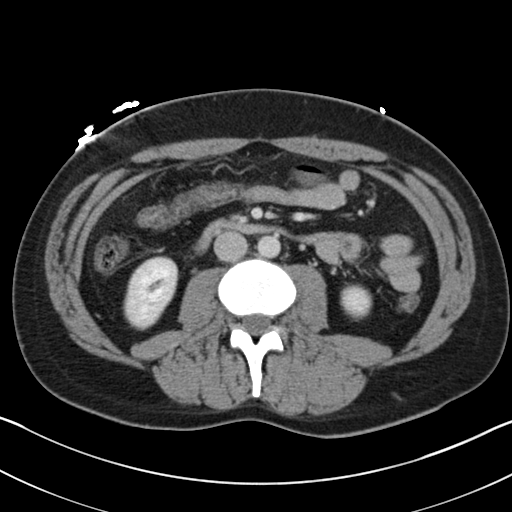
[im 58/91  soft-tissue]
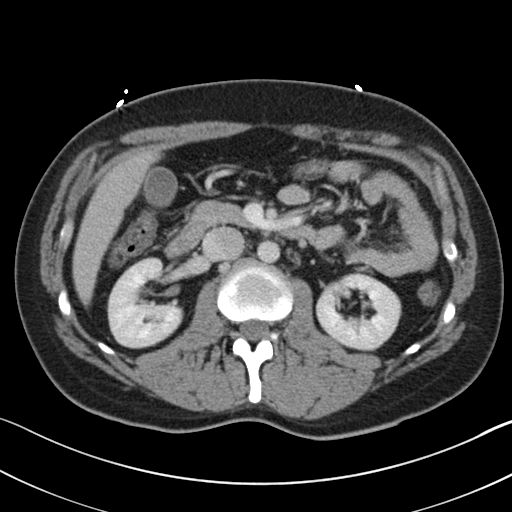
[im 58/91  bone]
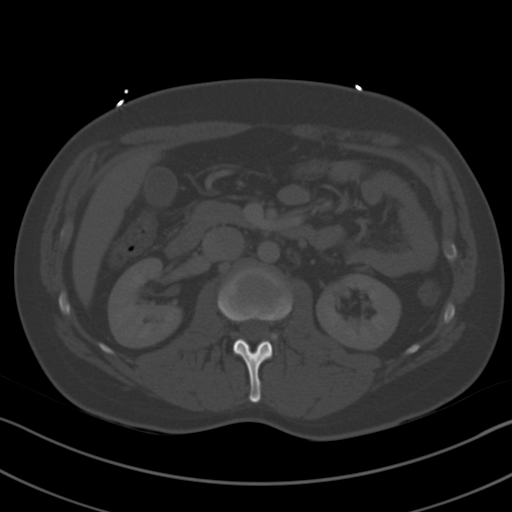
[im 65/91  soft-tissue]
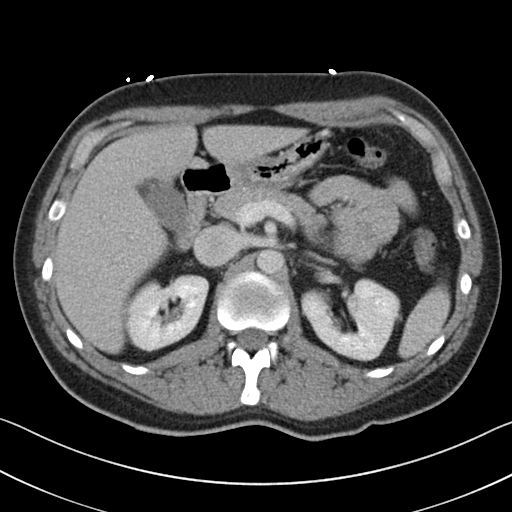
[im 73/91  soft-tissue]
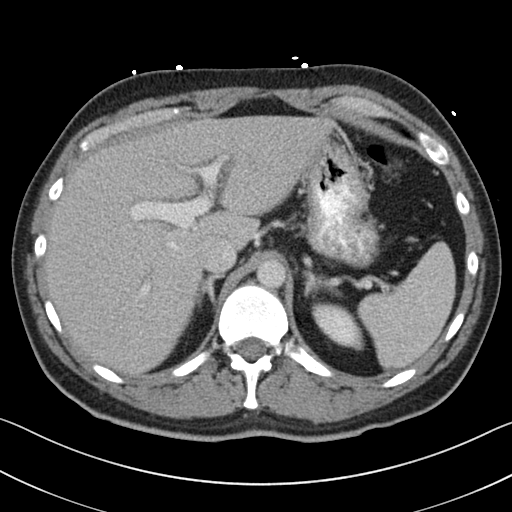
[im 76/91  lung]
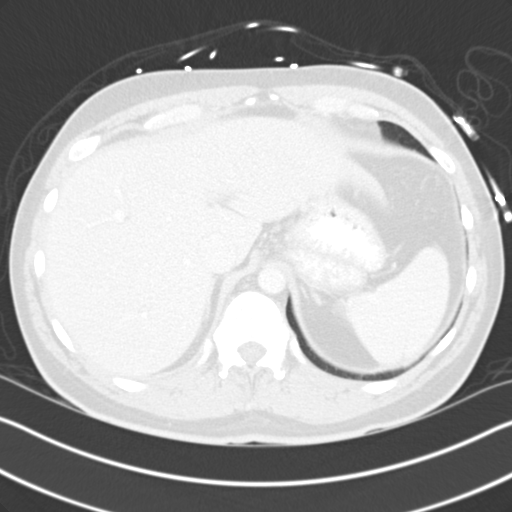
[im 80/91  soft-tissue]
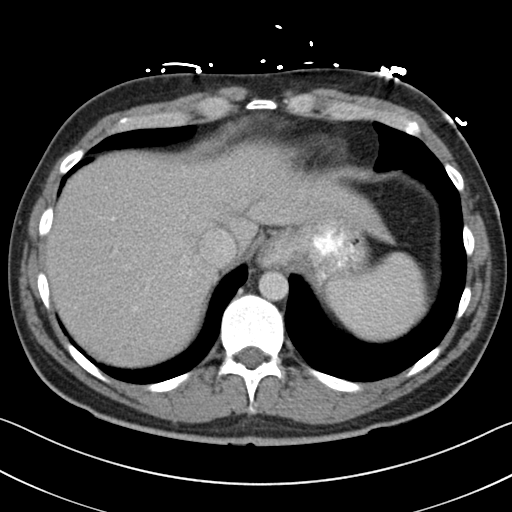
[im 80/91  lung]
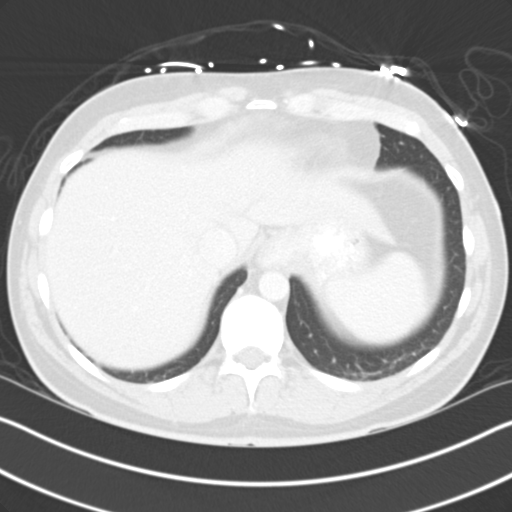
[im 83/91  lung]
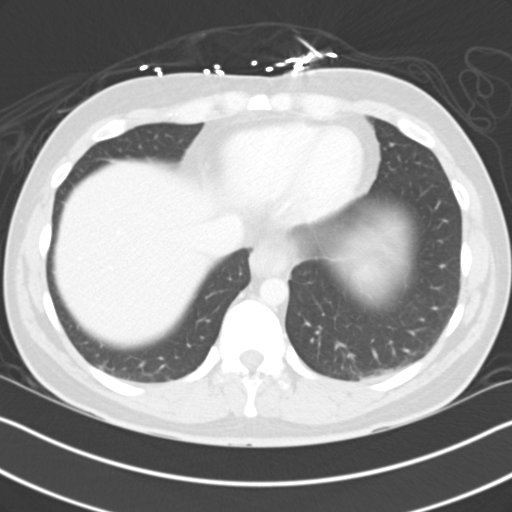
[im 87/91  soft-tissue]
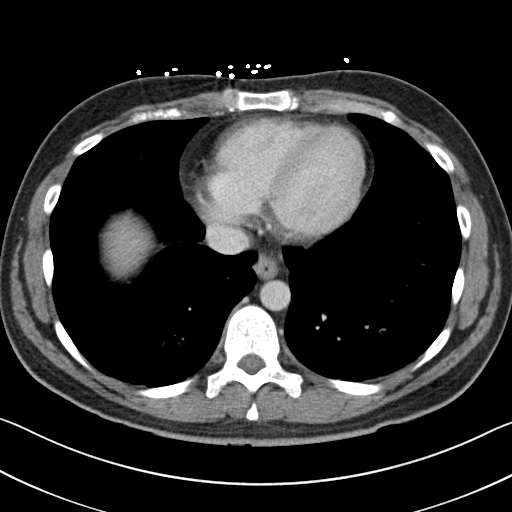
[im 87/91  lung]
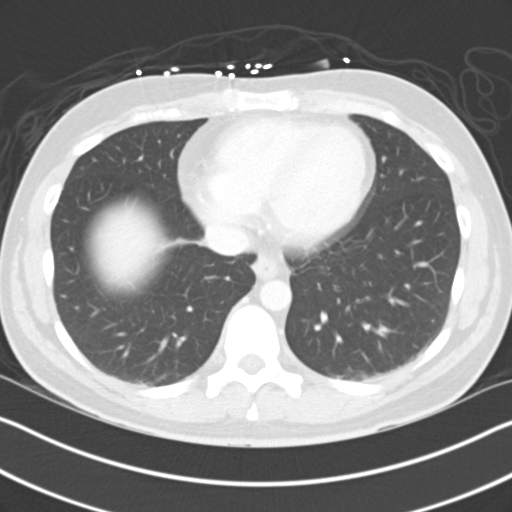

[15 of 32 positions shown; findings below may reference images not displayed]

FINDINGS: Lung bases are clear.  There is a small hiatal hernia.

There is fatty change in the liver. No focal liver lesions are
identified. There is no appreciable biliary duct dilatation. There
is evidence suggesting a degree of pericholecystic fluid.
Gallbladder wall does not appear thickened, and no gallstones are
seen by CT.

Spleen, pancreas, and adrenals appear normal. Kidneys bilaterally
show no mass or hydronephrosis on either side. There is no renal or
ureteral calculus apparent on either side.

In the pelvis, the urinary bladder is midline with normal wall
thickness. There is no pelvic mass or fluid collection.

The colon is largely decompressed. No frank colonic wall thickening
is appreciated on this study. Appendix appears normal. The terminal
ileum appears within normal limits.

There is no apparent bowel obstruction. No free air or portal venous
air.

There is no ascites, adenopathy, or abscess in the abdomen or
pelvis. There is no abdominal aortic aneurysm apparent. There are no
blastic or lytic bone lesions.
IMPRESSION: Findings concerning for pericholecystic fluid. Advise correlation
with ultrasound of the gallbladder to further evaluate.

Small hiatal hernia.

No bowel obstruction. No abscess. Appendix appears normal. No renal
or ureteral calculus. No hydronephrosis. The colon is largely
decompressed. Colon wall is not felt to be thickened when allowing
for the degree of colonic decompression.

## 2016-09-08 ENCOUNTER — Encounter (HOSPITAL_COMMUNITY): Payer: Self-pay | Admitting: Emergency Medicine

## 2016-09-08 ENCOUNTER — Emergency Department (HOSPITAL_COMMUNITY)
Admission: EM | Admit: 2016-09-08 | Discharge: 2016-09-08 | Disposition: A | Discharge status: Home / Self Care | Payer: Managed Care, Other (non HMO) | Attending: Emergency Medicine | Admitting: Emergency Medicine

## 2016-09-08 DIAGNOSIS — F17210 Nicotine dependence, cigarettes, uncomplicated: Secondary | ICD-10-CM | POA: Insufficient documentation

## 2016-09-08 DIAGNOSIS — T50901A Poisoning by unspecified drugs, medicaments and biological substances, accidental (unintentional), initial encounter: Secondary | ICD-10-CM | POA: Insufficient documentation

## 2016-09-08 DIAGNOSIS — X58XXXA Exposure to other specified factors, initial encounter: Secondary | ICD-10-CM | POA: Insufficient documentation

## 2016-09-08 DIAGNOSIS — Y92019 Unspecified place in single-family (private) house as the place of occurrence of the external cause: Secondary | ICD-10-CM | POA: Insufficient documentation

## 2016-09-08 DIAGNOSIS — Z79899 Other long term (current) drug therapy: Secondary | ICD-10-CM | POA: Insufficient documentation

## 2016-09-08 DIAGNOSIS — Y9389 Activity, other specified: Secondary | ICD-10-CM | POA: Insufficient documentation

## 2016-09-08 DIAGNOSIS — Y998 Other external cause status: Secondary | ICD-10-CM | POA: Insufficient documentation

## 2016-09-08 LAB — I-STAT CHEM 8, ED
BUN: 15 mg/dL (ref 6–20)
CALCIUM ION: 1.16 mmol/L (ref 1.15–1.40)
CHLORIDE: 102 mmol/L (ref 101–111)
Creatinine, Ser: 0.9 mg/dL (ref 0.61–1.24)
Glucose, Bld: 120 mg/dL — ABNORMAL HIGH (ref 65–99)
HCT: 40 % (ref 39.0–52.0)
Hemoglobin: 13.6 g/dL (ref 13.0–17.0)
Potassium: 3.7 mmol/L (ref 3.5–5.1)
SODIUM: 139 mmol/L (ref 135–145)
TCO2: 25 mmol/L (ref 0–100)

## 2016-09-08 NOTE — ED Provider Notes (Signed)
[facility]-EMERGENCY DEPT Provider Note   CSN: [id number] Arrival date & time: 09/08/16  2053     History   Chief Complaint Chief Complaint  Patient presents with  . Drug Overdose    HPI Preston Brewer is a 31 y.o. male.  The history is provided by the patient and medical records. No language interpreter was used.  Drug Overdose  Pertinent negatives include no abdominal pain.   Preston Brewer is a 31 y.o. male  with a PMH of bipolar disorder who presents to the Emergency Department For evaluation following drug overdose which occurred yesterday at 10:00 PM (just over 24 hours ago). Patient states that he typically takes 200 mg of Lamictal per day. Yesterday, his wife counted his pills and realized that he had 5 more pills than he should, therefore he took 6 all at once last night. Patient states this was not an attempt to harm himself and that he was "just being stupid" because he thought he needed to make up for all of the missed doses. This morning when he awoke, he was itching all over. He also had 2-3 loose, nonbloody stools as well as a few muscle spasms. He feels like the symptoms are improving. He states that he began feeling the symptoms of Lamictal overdoses and felt like it would be smart to come into the emergency department to get checked out. He has taken no medications today other than his usual 20 mg Adderall. He did not take the Lamictal today. No SI/HI or auditory/visual hallucinations.   Past Medical History:  Diagnosis Date  . Bipolar 1 disorder (HCC)   . GI bleed   . H/O endoscopy   . Ulcer     Patient Active Problem List   Diagnosis Date Noted  . Nausea and vomiting 10/06/2013  . Abdominal pain, acute, left upper quadrant 10/06/2013  . Cannabis abuse with intoxication with complication (HCC) 12/24/2012  . Cannabis overdose 12/24/2012  . Cyclic vomiting syndrome 12/24/2012    History reviewed. No pertinent surgical history.     Home Medications    Prior to  Admission medications   Medication Sig Start Date End Date Taking? Authorizing Provider  amphetamine-dextroamphetamine (ADDERALL) 20 MG tablet Take 20 mg by mouth 3 (three) times daily.   Yes [provider]  lamoTRIgine (LAMICTAL) 200 MG tablet Take 200 mg by mouth daily.   Yes [provider]  cyclobenzaprine (FLEXERIL) 10 MG tablet Take 1 tablet (10 mg total) by mouth 2 (two) times daily as needed for muscle spasms. Patient not taking: Reported on 09/08/2016 03/02/14   Marlon PelGreene, Tiffany, PA-C  metoCLOPramide (REGLAN) 10 MG tablet Take 1 tablet (10 mg total) by mouth every 8 (eight) hours as needed for nausea or vomiting. Patient not taking: Reported on 09/08/2016 12/30/13   Azalia Bilisampos, Kevin, MD  ondansetron (ZOFRAN ODT) 8 MG disintegrating tablet Take 1 tablet (8 mg total) by mouth every 8 (eight) hours as needed for nausea or vomiting. Patient not taking: Reported on 09/08/2016 12/30/13   Azalia Bilisampos, Kevin, MD  traMADol (ULTRAM) 50 MG tablet Take 1 tablet (50 mg total) by mouth every 6 (six) hours as needed. Patient not taking: Reported on 09/08/2016 03/02/14   Marlon PelGreene, Tiffany, PA-C    Family History No family history on file.  Social History Social History  Substance Use Topics  . Smoking status: Current Every Day Smoker    Packs/day: 1.00    Types: Cigarettes  . Smokeless tobacco: Never Used  . Alcohol use  3.0 oz/week    5 Cans of beer per week     Comment: occ     Allergies   Patient has no known allergies.   Review of Systems Review of Systems  Gastrointestinal: Positive for diarrhea and nausea. Negative for abdominal pain and vomiting.  Musculoskeletal: Positive for myalgias.  Skin:       + pruritis  All other systems reviewed and are negative.    Physical Exam Updated Vital Signs BP 108/61 (BP Location: Right Arm)   Pulse 78   Temp 98.3 F (36.8 C) (Oral)   Resp 12   Ht 6\' 1"  (1.854 m)   Wt 89.7 kg (197 lb 11.2 oz)   SpO2 97%   BMI 26.08 kg/m    Physical Exam  Constitutional: He is oriented to person, place, and time. He appears well-developed and well-nourished. No distress.  HENT:  Head: Normocephalic and atraumatic.  Cardiovascular: Normal rate, regular rhythm and normal heart sounds.   No murmur heard. Pulmonary/Chest: Effort normal and breath sounds normal. No respiratory distress.  Abdominal: Soft. He exhibits no distension. There is no tenderness.  Musculoskeletal: He exhibits no edema.  Neurological: He is alert and oriented to person, place, and time.  Speech clear and goal oriented. CN 2-12 grossly intact. Normal finger-to-nose and rapid alternating movements. No drift. Strength and sensation intact. Steady gait.  Skin: Skin is warm and dry.  Nursing note and vitals reviewed.    ED Treatments / Results  Labs (all labs ordered are listed, but only abnormal results are displayed) Labs Reviewed  I-STAT CHEM 8, ED - Abnormal; Notable for the following:       Result Value   Glucose, Bld 120 (*)    All other components within normal limits    EKG  EKG Interpretation None       Radiology No results found.  Procedures Procedures (including critical care time)  Medications Ordered in ED Medications - No data to display   Initial Impression / Assessment and Plan / ED Course  I have reviewed the triage vital signs and the nursing notes.  Pertinent labs & imaging results that were available during my care of the patient were reviewed by me and considered in my medical decision making (see chart for details).    Preston Brewer is a 31 y.o. male who presents to ED for evaluation following Lamictal overdose just over 24 hours ago. On exam, patient is afebrile, hemodynamically stable with benign physical examination. Chem-8 reassuring. Poison Control was consulted who states that given the timeframe, there is nothing further to monitor or to be done. They do recommend having someone stay with him tonight just in  case worsening symptoms develop. They also recommend for him to call his physician who prescribes this medication in the morning to let them know this occurred and inquire when to resume medication. The recommendations above were relayed to patient who understands and agrees with this plan.   Final Clinical Impressions(s) / ED Diagnoses   Final diagnoses:  Accidental drug overdose, initial encounter    New Prescriptions New Prescriptions   No medications on file     Shirel Mallis, Chase Picket, PA-C 09/08/16 2251    Doug Sou, MD 09/09/16 [phone]

## 2016-09-08 NOTE — ED Notes (Signed)
Pt from home following ingesting 6 200 mg Lamictal tablets last night around 2200 following an argument with his wife. Pt states this was not an attempt to end his life. Pt denies n/v/d but does state he feels disoriented and itchy since ingestion.

## 2016-09-08 NOTE — ED Notes (Signed)
Patient states that he took the medication because his wife was hounding him about missing doses of his medication. So he decided to take all the doses that he missed all at one time.

## 2016-09-08 NOTE — ED Provider Notes (Addendum)
Patient took five 200 mg Lamictal tablets at 10 PM yesterday in an effort to "catch up" he had missed several doses of Lamictal and took extra medication in order to attempt to get the proper amount in his system. He vehemently denies that he was trying to harm himself. Symptoms include mild diffuse pruritus and feels tired. Patient is alert and in no distress. Glasgow Coma Score 15 appears comfortable. Patient stable for discharge. Poison control notified. They have no further recommendations   Doug SouJacubowitz, Illiana Losurdo, MD 09/08/16 [id number]    Doug SouJacubowitz, Tuana Hoheisel, MD 09/08/16 2314

## 2016-09-08 NOTE — ED Notes (Signed)
Bed: WA25 Expected date:  Expected time:  Means of arrival:  Comments: 

## 2016-09-08 NOTE — Discharge Instructions (Signed)
Please call your doctor who prescribes your Lamictal in the morning. Let them know about today's visit and ask when you should resume taking your typical 200mg  daily dose.   Increase hydration.   Return to ER for new or worsening symptoms, any additional concerns.

## 2017-06-26 ENCOUNTER — Ambulatory Visit (HOSPITAL_COMMUNITY)
Admission: EM | Admit: 2017-06-26 | Discharge: 2017-06-26 | Disposition: A | Discharge status: Home / Self Care | Payer: Worker's Compensation | Attending: Internal Medicine | Admitting: Internal Medicine

## 2017-06-26 ENCOUNTER — Encounter (HOSPITAL_COMMUNITY): Payer: Self-pay | Admitting: Emergency Medicine

## 2017-06-26 DIAGNOSIS — W260XXA Contact with knife, initial encounter: Secondary | ICD-10-CM | POA: Diagnosis not present

## 2017-06-26 DIAGNOSIS — S61210A Laceration without foreign body of right index finger without damage to nail, initial encounter: Secondary | ICD-10-CM | POA: Diagnosis not present

## 2017-06-26 MED ORDER — ONDANSETRON HCL 4 MG PO TABS: 4.0000 mg | ORAL_TABLET | Freq: Four times a day (QID) | ORAL | 0 refills | Status: DC

## 2017-06-26 NOTE — ED Triage Notes (Signed)
Provider discharge.

## 2017-06-26 NOTE — ED Provider Notes (Signed)
[facility]    CSN: [id number] Arrival date & time: 06/26/17  1656     History   Chief Complaint Chief Complaint  Patient presents with  . Finger Injury    HPI Preston Brewer is a 32 y.o. male presenting today with finger laceration.  States that he was working and cutting pita bread and accidentally cut his finger.  States tetanus was in the past 2 years when he previously cut his finger.  Denies any numbness or tingling, just persistent bleeding.  HPI  Past Medical History:  Diagnosis Date  . Bipolar 1 disorder (HCC)   . GI bleed   . H/O endoscopy   . Ulcer     Patient Active Problem List   Diagnosis Date Noted  . Nausea and vomiting 10/06/2013  . Abdominal pain, acute, left upper quadrant 10/06/2013  . Cannabis abuse with intoxication with complication (HCC) 12/24/2012  . Cannabis overdose 12/24/2012  . Cyclic vomiting syndrome 12/24/2012    History reviewed. No pertinent surgical history.     Home Medications    Prior to Admission medications   Medication Sig Start Date End Date Taking? Authorizing Provider  amphetamine-dextroamphetamine (ADDERALL) 20 MG tablet Take 20 mg by mouth 3 (three) times daily.    [provider]  cyclobenzaprine (FLEXERIL) 10 MG tablet Take 1 tablet (10 mg total) by mouth 2 (two) times daily as needed for muscle spasms. Patient not taking: Reported on 09/08/2016 03/02/14   Marlon PelGreene, Tiffany, PA-C  lamoTRIgine (LAMICTAL) 200 MG tablet Take 200 mg by mouth daily.    [provider]  metoCLOPramide (REGLAN) 10 MG tablet Take 1 tablet (10 mg total) by mouth every 8 (eight) hours as needed for nausea or vomiting. Patient not taking: Reported on 09/08/2016 12/30/13   Azalia Bilisampos, Kevin, MD  ondansetron (ZOFRAN ODT) 8 MG disintegrating tablet Take 1 tablet (8 mg total) by mouth every 8 (eight) hours as needed for nausea or vomiting. Patient not taking: Reported on 09/08/2016 12/30/13   Azalia Bilisampos, Kevin, MD  ondansetron  (ZOFRAN) 4 MG tablet Take 1 tablet (4 mg total) by mouth every 6 (six) hours. 06/26/17   Wieters, Hallie C, PA-C  traMADol (ULTRAM) 50 MG tablet Take 1 tablet (50 mg total) by mouth every 6 (six) hours as needed. Patient not taking: Reported on 09/08/2016 03/02/14   Marlon PelGreene, Tiffany, PA-C    Family History History reviewed. No pertinent family history.  Social History Social History   Tobacco Use  . Smoking status: Current Every Day Smoker    Packs/day: 1.00    Types: Cigarettes  . Smokeless tobacco: Never Used  Substance Use Topics  . Alcohol use: Yes    Alcohol/week: 3.0 oz    Types: 5 Cans of beer per week    Comment: occ  . Drug use: No    Comment: hx marijuana, last use 2 months ago     Allergies   Patient has no known allergies.   Review of Systems Review of Systems  Constitutional: Negative for fatigue and fever.  Respiratory: Negative for shortness of breath.   Cardiovascular: Negative for chest pain.  Gastrointestinal: Negative for diarrhea, nausea and vomiting.  Musculoskeletal: Negative for myalgias.  Skin: Positive for wound. Negative for color change, pallor and rash.  Neurological: Negative for weakness, numbness and headaches.     Physical Exam Triage Vital Signs ED Triage Vitals  Enc Vitals Group     BP      Pulse  Resp      Temp      Temp src      SpO2      Weight      Height      Head Circumference      Peak Flow      Pain Score      Pain Loc      Pain Edu?      Excl. in GC?    No data found.  Updated Vital Signs There were no vitals taken for this visit.  Visual Acuity Right Eye Distance:   Left Eye Distance:   Bilateral Distance:    Right Eye Near:   Left Eye Near:    Bilateral Near:     Physical Exam  Constitutional: He appears well-developed and well-nourished.  HENT:  Head: Normocephalic and atraumatic.  Eyes: Conjunctivae are normal.  Neck: Neck supple.  Cardiovascular: Normal rate.  Pulmonary/Chest: Effort  normal. No respiratory distress.  Musculoskeletal: He exhibits no edema.  Neurological: He is alert.  Skin: Skin is warm and dry.  1 cm laceration to left index finger to distal aspect.  Radial pulse 2+  Psychiatric: He has a normal mood and affect.  Nursing note and vitals reviewed.    UC Treatments / Results  Labs (all labs ordered are listed, but only abnormal results are displayed) Labs Reviewed - No data to display  EKG None Radiology No results found.  Procedures Laceration Repair Date/Time: 06/26/2017 7:22 PM Performed by: Wieters, Junius Creamer, PA-C Authorized by: Isa Rankin, MD   Consent:    Consent obtained:  Verbal   Consent given by:  Patient   Risks discussed:  Pain, poor cosmetic result and infection   Alternatives discussed:  No treatment Anesthesia (see MAR for exact dosages):    Anesthesia method:  Local infiltration   Local anesthetic:  Lidocaine 2% w/o epi Laceration details:    Location:  Finger   Finger location:  L index finger   Length (cm):  1   Depth (mm):  3 Repair type:    Repair type:  Simple Pre-procedure details:    Preparation:  Patient was prepped and draped in usual sterile fashion Exploration:    Hemostasis achieved with:  Tourniquet and direct pressure   Wound exploration: wound explored through full range of motion     Wound extent: no foreign bodies/material noted, no muscle damage noted and no tendon damage noted     Contaminated: no   Treatment:    Area cleansed with:  Soap and water   Amount of cleaning:  Standard   Irrigation solution:  Tap water   Irrigation method:  Tap   Visualized foreign bodies/material removed: no   Skin repair:    Repair method:  Sutures   Suture size:  5-0   Suture material:  Prolene   Suture technique:  Simple interrupted   Number of sutures:  5 Approximation:    Approximation:  Close Post-procedure details:    Dressing:  Bulky dressing   Patient tolerance of procedure:  Tolerated  well, no immediate complications Comments:     Patient did get slightly nauseous during suturing, but no active vomiting.   (including critical care time)  Medications Ordered in UC Medications - No data to display   Initial Impression / Assessment and Plan / UC Course  I have reviewed the triage vital signs and the nursing notes.  Pertinent labs & imaging results that were available during my care  of the patient were reviewed by me and considered in my medical decision making (see chart for details).     Laceration repaired, tolerated well.  Advised to keep clean and dry for next 48 hours, discussed signs of infection to return.  Return in 7-10 days for removal. Discussed strict return precautions. Patient verbalized understanding and is agreeable with plan.   Final Clinical Impressions(s) / UC Diagnoses   Final diagnoses:  Laceration of right index finger, foreign body presence unspecified, nail damage status unspecified, initial encounter    ED Discharge Orders        Ordered    ondansetron (ZOFRAN) 4 MG tablet  Every 6 hours     06/26/17 1904       Controlled Substance Prescriptions [place] Controlled Substance Registry consulted? Not Applicable   Lew Dawes, [place] 06/26/17 1925

## 2017-06-26 NOTE — Discharge Instructions (Signed)
Keep clean and dry for next 24-48 hours.  Wash with warm soapy water daily.  Please return if developing redness, swelling, drainage or increased pain.  Please return in 7-10 days to have sutures removed.

## 2018-03-24 ENCOUNTER — Other Ambulatory Visit: Payer: Self-pay | Admitting: Psychiatry

## 2018-03-24 NOTE — Telephone Encounter (Signed)
Need to review paper chart  

## 2018-05-12 ENCOUNTER — Other Ambulatory Visit: Payer: Self-pay | Admitting: Psychiatry

## 2018-05-12 NOTE — Telephone Encounter (Signed)
Not seen in epic review chart

## 2018-05-16 ENCOUNTER — Telehealth: Payer: Self-pay | Admitting: Psychiatry

## 2018-05-16 NOTE — Telephone Encounter (Signed)
Pt needs refill on Lamictal sent to [facility] on [address]. Pt does not have insurance now but has already talked to [facility].

## 2018-05-17 NOTE — Telephone Encounter (Signed)
Are you ok with his refills? No OV

## 2018-05-18 ENCOUNTER — Telehealth: Payer: Self-pay | Admitting: Psychiatry

## 2018-05-18 ENCOUNTER — Other Ambulatory Visit: Payer: Self-pay

## 2018-05-18 MED ORDER — LAMOTRIGINE 200 MG PO TABS: 200.0000 mg | ORAL_TABLET | Freq: Every day | ORAL | 0 refills | Status: DC

## 2018-05-18 NOTE — Telephone Encounter (Signed)
Pt called ask for refill Lamictal 200 mg 1/d @ [facility] [phone]. Pt does not have insurance.

## 2018-05-18 NOTE — Telephone Encounter (Signed)
See previous message, pt's wrong phone number in epic pt called back with correct number. Is not out of medication so ok to send in rx to [facility].

## 2018-05-18 NOTE — Telephone Encounter (Signed)
Left voicemail but not sure number is correct, woman's voice. Only number listed

## 2018-05-18 NOTE — Telephone Encounter (Signed)
Pt on lamictal 100mg  2/day. If he hasnt missed more than 5 days he can have 1 more refill. But no more refills afterwards Thanks, Mat Carne

## 2018-05-18 NOTE — Telephone Encounter (Signed)
rx sent  Wrong phone number in epic

## 2018-09-22 ENCOUNTER — Other Ambulatory Visit: Payer: Self-pay

## 2018-09-22 ENCOUNTER — Telehealth: Payer: Self-pay | Admitting: Psychiatry

## 2018-09-22 MED ORDER — LAMOTRIGINE 200 MG PO TABS: 200.0000 mg | ORAL_TABLET | Freq: Every day | ORAL | 0 refills | Status: DC

## 2018-09-22 NOTE — Telephone Encounter (Signed)
Preston Brewer called for refill of his lamictal.  He was a pt of clays.  Is scheduled to see [place] 10/04/18.  Please send to [facility].

## 2018-09-22 NOTE — Telephone Encounter (Signed)
Submitted

## 2018-10-04 ENCOUNTER — Ambulatory Visit: Payer: Self-pay | Admitting: Psychiatry

## 2018-10-04 ENCOUNTER — Other Ambulatory Visit: Payer: Self-pay

## 2018-10-04 ENCOUNTER — Encounter: Payer: Self-pay | Admitting: Psychiatry

## 2018-10-04 VITALS — Ht 74.0 in | Wt 227.0 lb

## 2018-10-04 DIAGNOSIS — F1221 Cannabis dependence, in remission: Secondary | ICD-10-CM

## 2018-10-04 DIAGNOSIS — F902 Attention-deficit hyperactivity disorder, combined type: Secondary | ICD-10-CM

## 2018-10-04 DIAGNOSIS — F3173 Bipolar disorder, in partial remission, most recent episode manic: Secondary | ICD-10-CM | POA: Insufficient documentation

## 2018-10-04 MED ORDER — LAMOTRIGINE 200 MG PO TABS: 200.0000 mg | ORAL_TABLET | Freq: Every day | ORAL | 2 refills | Status: DC

## 2018-10-04 NOTE — Progress Notes (Signed)
[facility]  Patient ID: Preston BilletSeth Brewer,  MRN: [id number]  PCP: Patient, No Pcp Per  Date of Evaluation: 10/04/2018 Time spent:20 minutes from 0920 to 0940  Chief Complaint:  Chief Complaint    Manic Behavior; Depression; ADHD      HISTORY/CURRENT STATUS: Preston Brewer is seen individually in office onsite face-to-face with consent with epic and old chart of most recent provider Anne Fulay Shugart now deceased collateral for psychiatric interview and exam in 5162-month evaluation and management of bipolar and ADHD.  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  His daughter is just 121 year of age and he is married, wife stopping cigarettes with pregnancy so he stopped including vaping January 2019.  He has occasional beer socially but otherwise is free of all substances determining in the course of time that cannabis was the cause of his cyclic vomiting now resolved off of it.  He is driving without difficulty including no accident or citation.  He considers that he has lost weight though epic documents gain.  His Lamictal has been provided by the office despite being overdue for follow-up paying $19 [facility] 5983[facility]  He has had predominant manic symptoms in the past with his bipolar though noted to [facility], and Dr. Quintella ReichertAiken documenting a depressive episode in the past.  He is not currently manic, psychotic, intoxicated, delirious, or suicidal.  Depression        This is a recurrent problem.  The current episode started more than 1 year ago.   The onset quality is sudden.   The problem occurs rarely.  The problem has been rapidly improving since onset.  Associated symptoms include decreased concentration, irritable, restlessness and indigestion.  Associated symptoms include no  helplessness, no hopelessness, does not have insomnia, not sad and no suicidal ideas.     The symptoms are aggravated by work stress, family issues and social issues.  Past treatments include other medications and psychotherapy.  Compliance with treatment is variable.  Past compliance problems include difficulty with treatment plan, medication issues and insurance issues.  Risk factors include a change in medication usage/dosage, substance abuse, prior psychiatric admission, stress, major life event, family history of mental illness and family history.   Past medical history includes bipolar disorder and mental health disorder.     Pertinent negatives include no life-threatening condition, no physical disability, no recent psychiatric admission, no anxiety, no eating disorder, no obsessive-compulsive disorder, no post-traumatic stress disorder, no schizophrenia and no head trauma.   Individual Medical History/ Review of Systems: Changes? :No  Eyeglasses Allergies: Patient has no known allergies.  Current Medications:  Current Outpatient Medications:  .  cyclobenzaprine (FLEXERIL) 10 MG tablet, Take 1 tablet (10 mg total) by mouth 2 (two) times daily as needed for muscle spasms. (Patient not taking: Reported on 09/08/2016), Disp: 20 tablet, Rfl: 0 .  lamoTRIgine (LAMICTAL) 200 MG tablet, Take 1 tablet (200 mg total) by mouth daily after breakfast., Disp: 90 tablet, Rfl: 2 .  metoCLOPramide (REGLAN) 10 MG tablet, Take 1 tablet (10 mg total) by mouth every 8 (eight) hours as needed for nausea or vomiting. (Patient not taking: Reported on 09/08/2016), Disp: 30 tablet, Rfl: 0 .  ondansetron (ZOFRAN ODT) 8 MG disintegrating tablet, Take 1 tablet (8 mg total)  by mouth every 8 (eight) hours as needed for nausea or vomiting. (Patient not taking: Reported on 09/08/2016), Disp: 10 tablet, Rfl: 0 .  ondansetron (ZOFRAN) 4 MG tablet, Take 1 tablet (4 mg total) by mouth every 6 (six) hours., Disp: 12 tablet, Rfl: 0 .   traMADol (ULTRAM) 50 MG tablet, Take 1 tablet (50 mg total) by mouth every 6 (six) hours as needed. (Patient not taking: Reported on 09/08/2016), Disp: 15 tablet, Rfl: 0  Medication Side Effects: none  Family Medical/ Social History: Changes? Yes mother had ADHD, anxiety and bipolar disorder while there was mental illness on the paternal side of the family as well  MENTAL HEALTH EXAM:  Height 6\' 2"  (1.88 m), weight 227 lb (103 kg).Body mass index is 29.15 kg/m.  Others deferred as nonessential in coronavirus pandemic  General Appearance: Casual and Fairly Groomed  Eye Contact:  Good  Speech:  Clear and Coherent, Normal Rate, Pressured and Talkative  Volume:  Normal  Mood:  Euphoric, Euthymic and Worthless  Affect:  Labile and Full Range  Thought Process:  Coherent, Goal Directed and Linear  Orientation:  Full (Time, Place, and Person)  Thought Content: Rumination   Suicidal Thoughts:  No  Homicidal Thoughts:  No  Memory:  Immediate;   Fair Remote;   Fair  Judgement:  Fair to good  Insight:  Fair  Psychomotor Activity:  Normal, Increased and Mannerisms  Concentration:  Concentration: Fair and Attention Span: Fair  Recall:  [employer] of Knowledge: Fair  Language: Fair  Assets:  Desire for Improvement Leisure Time Social Support Talents/Skills  ADL's:  Intact  Cognition: WNL  Prognosis:  Fair    DIAGNOSES:    ICD-10-CM   1. Bipolar I disorder, current or most recent episode manic, in partial remission (HCC)  F31.73 lamoTRIgine (LAMICTAL) 200 MG tablet  2. Attention deficit hyperactivity disorder (ADHD), combined type, mild  F90.2   3. Cannabis use disorder, moderate, in sustained remission ([place])  F12.21     Receiving Psychotherapy: No    RECOMMENDATIONS: Sobriety, compliance with Lamictal, and commitment and responsibility for family and job are addressed throughout the session.  Over 50% of the time is spent in counseling and coordination of care for treatment symptom  matching warnings and risks of diagnoses and treatment including medication for prevention and monitoring, safety hygiene, and crisis plans if needed with CBT behavioral nutrition, sleep hygiene, social skills and anger management.  He is E scribed Lamictal 200 mg every morning having 90-day supply sent 09/22/2018 to [facility] sending today another 75-month supply and 2 refills.  He returns for follow-up in 1 year.   Delight Hoh, MD

## 2019-04-26 ENCOUNTER — Ambulatory Visit: Payer: Managed Care, Other (non HMO) | Attending: Internal Medicine

## 2019-04-26 DIAGNOSIS — Z20822 Contact with and (suspected) exposure to covid-19: Secondary | ICD-10-CM

## 2019-04-27 LAB — NOVEL CORONAVIRUS, NAA: SARS-CoV-2, NAA: NOT DETECTED

## 2019-10-04 ENCOUNTER — Ambulatory Visit: Payer: Self-pay | Admitting: Psychiatry

## 2019-10-05 ENCOUNTER — Ambulatory Visit (INDEPENDENT_AMBULATORY_CARE_PROVIDER_SITE_OTHER): Payer: Self-pay | Admitting: Psychiatry

## 2019-10-05 ENCOUNTER — Other Ambulatory Visit: Payer: Self-pay

## 2019-10-05 ENCOUNTER — Encounter: Payer: Self-pay | Admitting: Psychiatry

## 2019-10-05 VITALS — Ht 74.0 in | Wt 215.0 lb

## 2019-10-05 DIAGNOSIS — F3173 Bipolar disorder, in partial remission, most recent episode manic: Secondary | ICD-10-CM

## 2019-10-05 DIAGNOSIS — F902 Attention-deficit hyperactivity disorder, combined type: Secondary | ICD-10-CM

## 2019-10-05 MED ORDER — LAMOTRIGINE 200 MG PO TABS: 200.0000 mg | ORAL_TABLET | Freq: Every day | ORAL | 3 refills | Status: DC

## 2019-10-05 NOTE — Progress Notes (Signed)
[facility]  Patient ID: Preston Brewer,  MRN: [id number]  PCP: Patient, No Pcp Per  Date of Evaluation: 10/05/2019 Time spent:15 minutes  From 1410 to 1425 Chief Complaint:  Chief Complaint    Depression; Manic Behavior; ADHD      HISTORY/CURRENT STATUS: Preston Brewer is seen onsite in office 15 minutes face-to-face individually with consent with epic collateral for psychiatric interview and exam in 1 year evaluation and management of bipolar 1 most recently manic in partial remission and and ADHD having no residual cannabis or other substance use disorder.  He last stopped vaping after stopping cigarette smoking January 2020  both of which have been successful other than significant 15 pound weight gain immediately that he has now remitted so that his weight was down 15 pounds last year and 12 pounds in this year with goal weight of 200 pounds.  His daughter born on 4 July is now 59 years of age, and daycare is more expensive than their rent.  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  The Lamictal works well taking it every morning having no side effects and wishing to continue.  He reviews consequences in the past from mood disorder and ADHD feeling he has outgrown the ADHD and just has to keep Lamictal going for his mood 200 mg IR every morning.  He has no mania, suicidality, psychosis or delirium.   Individual Medical History/ Review of Systems: Changes? :Yes weight is down 12 pounds in the last year with goal weight of 200 pounds  Allergies: Patient has no known allergies.  Current Medications:  Current Outpatient Medications:  .  cyclobenzaprine (FLEXERIL) 10 MG tablet, Take 1 tablet (10 mg total) by mouth 2 (two) times daily as needed for muscle spasms. (Patient not taking: Reported on 09/08/2016), Disp: 20 tablet, Rfl: 0 .  lamoTRIgine (LAMICTAL) 200 MG tablet, Take 1 tablet (200 mg total) by mouth daily after breakfast., Disp: 90 tablet, Rfl: 3 .   metoCLOPramide (REGLAN) 10 MG tablet, Take 1 tablet (10 mg total) by mouth every 8 (eight) hours as needed for nausea or vomiting. (Patient not taking: Reported on 09/08/2016), Disp: 30 tablet, Rfl: 0 .  ondansetron (ZOFRAN ODT) 8 MG disintegrating tablet, Take 1 tablet (8 mg total) by mouth every 8 (eight) hours as needed for nausea or vomiting. (Patient not taking: Reported on 09/08/2016), Disp: 10 tablet, Rfl: 0 .  ondansetron (ZOFRAN) 4 MG tablet, Take 1 tablet (4 mg total) by mouth every 6 (six) hours., Disp: 12 tablet, Rfl: 0 .  traMADol (ULTRAM) 50 MG tablet, Take 1 tablet (50 mg total) by mouth every 6 (six) hours as needed. (Patient not taking: Reported on 09/08/2016), Disp: 15 tablet, Rfl: 0  Medication Side Effects: none  Family Medical/ Social History: Changes? No  MENTAL HEALTH EXAM:  Height 6\' 2"  (1.88 m), weight 215 lb (97.5 kg).Body mass index is 27.6 kg/m. Muscle strengths and tone 5/5, postural reflexes and gait 0/0, and AIMS = 0.  General Appearance: Casual, Fairly Groomed and Meticulous  Eye Contact:  Good  Speech:  Clear and Coherent, Normal Rate and Talkative  Volume:  Normal  Mood:  Euthymic  Affect:  Congruent, Inappropriate, Labile and Full Range  Thought Process:  Coherent, Goal Directed, Linear and Descriptions of Associations: Tangential  Orientation:  Full (Time, Place, and Person)  Thought Content: Rumination and Tangential   Suicidal Thoughts:  No  Homicidal Thoughts:  No  Memory:  Immediate;   Good and Fair Remote;   Good and Fair  Judgement:  Good  Insight:  Good  Psychomotor Activity:  Normal and Mannerisms  Concentration:  Concentration: Fair and Attention Span: Fair  Recall:  Good  Fund of Knowledge: Good  Language: Good  Assets:  Desire for Improvement Resilience Social Support Talents/Skills  ADL's:  Intact  Cognition: WNL  Prognosis:  Good    DIAGNOSES:    ICD-10-CM   1. Bipolar I disorder, current or most recent episode manic, in  partial remission (HCC)  F31.73 lamoTRIgine (LAMICTAL) 200 MG tablet  2. Attention deficit hyperactivity disorder (ADHD), combined type, mild  F90.2     Receiving Psychotherapy: No    RECOMMENDATIONS: Prevention and monitoring safety hygiene updates are integrated with symptom treatment matching for medication concluding to continue Lamictal as the only medication currently.  He is E scribed Lamictal 200 mg IR tablet every morning after breakfast sent as #90 with 3 refills to [facility] pharmacy for bipolar disorder.  He no longer requires medication for ADHD.  He has no current substance use concerns.  He is much improved over last year and effectively generalizes skills to current and future needs for strength and problems.  He will follow-up in 1 year or sooner if needed.   Preston Mann, MD

## 2020-01-10 ENCOUNTER — Encounter: Payer: Self-pay | Admitting: Psychiatry

## 2020-03-21 DIAGNOSIS — Z1152 Encounter for screening for COVID-19: Secondary | ICD-10-CM | POA: Diagnosis not present

## 2020-10-03 ENCOUNTER — Ambulatory Visit: Payer: Self-pay | Admitting: Psychiatry

## 2020-10-04 ENCOUNTER — Encounter: Payer: Self-pay | Admitting: Behavioral Health

## 2020-10-04 ENCOUNTER — Ambulatory Visit: Payer: BC Managed Care – PPO | Admitting: Behavioral Health

## 2020-10-04 DIAGNOSIS — F3173 Bipolar disorder, in partial remission, most recent episode manic: Secondary | ICD-10-CM

## 2020-10-04 MED ORDER — LAMOTRIGINE 200 MG PO TABS: 200.0000 mg | ORAL_TABLET | Freq: Every day | ORAL | 3 refills | Status: DC

## 2020-10-04 NOTE — Progress Notes (Signed)
[facility]  Patient ID: Maribel Luis,  MRN: [id number]  PCP: Pcp, No  Date of Evaluation: 10/04/2020 Time spent:40 minutes  Chief Complaint:   HISTORY/CURRENT STATUS: HPI  35 year old male presents to this office for follow up and medication management. Says that he continues to remain stable with no reoccurring  mania in 9 years. Has not had any episodes of bipolar depression. He says that Lamictal was wonderful for him after experiencing so many medication changes when he was younger. He reports depression today at 0/10 and anxiety 0/10. Reports sleeping 7-8 hours per night. No current mania or psychosis. No SI/HI. Patient is in remission but will be continuing Lamictal as maintenance medication.      Individual Medical History/ Review of Systems: Changes? :No   Allergies: Patient has no known allergies.  Current Medications:  Current Outpatient Medications:    lamoTRIgine (LAMICTAL) 200 MG tablet, Take 1 tablet (200 mg total) by mouth daily after breakfast., Disp: 90 tablet, Rfl: 3 Medication Side Effects: none  Family Medical/ Social History: Changes? No  MENTAL HEALTH EXAM:  Blood pressure 136/86, pulse 99, height 6' 1.5" (1.867 m), weight 239 lb (108.4 kg).Body mass index is 31.1 kg/m.  General Appearance: Casual and Neat  Eye Contact:  Good  Speech:  Clear and Coherent  Volume:  Normal  Mood:  NA  Affect:  Appropriate  Thought Process:  Coherent  Orientation:  Full (Time, Place, and Person)  Thought Content: Logical   Suicidal Thoughts:  No  Homicidal Thoughts:  No  Memory:  WNL  Judgement:  Good  Insight:  Good  Psychomotor Activity:  Normal  Concentration:  Concentration: Good  Recall:  Good  Fund of Knowledge: Good  Language: Good  Assets:  Desire for Improvement  ADL's:  Intact  Cognition: WNL  Prognosis:  Good    DIAGNOSES:    ICD-10-CM   1. Bipolar I disorder, current or most recent episode manic, in partial remission (HCC)   F31.73 lamoTRIgine (LAMICTAL) 200 MG tablet      Receiving Psychotherapy: No    RECOMMENDATIONS:  Continue on Lamictal 200 mg daily Will call if experiencing worsening symptoms Pt is stable and has not experienced mania in 9 years. Request annual med checks. Refills e scribed to patients pharmacy Greater than 50% of 40 min.face to face time with patient was spent on counseling and coordination of care. We discussed his continuing remission with bipolar disorder. Discussed his history since he was former Pt. Of Dr. Marlyne Beards.   Reinforced with patient symptoms of SJS.    Joan Flores, NP

## 2020-11-06 ENCOUNTER — Other Ambulatory Visit: Payer: Self-pay

## 2020-11-06 ENCOUNTER — Encounter (HOSPITAL_BASED_OUTPATIENT_CLINIC_OR_DEPARTMENT_OTHER): Payer: Self-pay | Admitting: Family Medicine

## 2020-11-06 ENCOUNTER — Ambulatory Visit (INDEPENDENT_AMBULATORY_CARE_PROVIDER_SITE_OTHER): Payer: BC Managed Care – PPO | Admitting: Family Medicine

## 2020-11-06 VITALS — BP 132/82 | HR 87 | Ht 73.0 in | Wt 236.6 lb

## 2020-11-06 DIAGNOSIS — E669 Obesity, unspecified: Secondary | ICD-10-CM

## 2020-11-06 DIAGNOSIS — L989 Disorder of the skin and subcutaneous tissue, unspecified: Secondary | ICD-10-CM | POA: Insufficient documentation

## 2020-11-06 MED ORDER — TRIAMCINOLONE ACETONIDE 0.1 % EX CREA: 1.0000 "application " | TOPICAL_CREAM | Freq: Two times a day (BID) | CUTANEOUS | 0 refills | Status: AC

## 2020-11-06 NOTE — Patient Instructions (Signed)
  Medication Instructions:  Your physician has recommended you make the following change in your medication: o -- START Triamcinolone Topical - apply two times daily   --If you need a refill on any your medications before your next appointment, please call your pharmacy first. If no refills are authorized on file call the office.--  Referrals/Procedures/Imaging: A referral has been placed for you to [facility] for evaluation and treatment. Someone from the scheduling department will be in contact with you in regards to coordinating your consultation. If you do not hear from any of the schedulers within 7-10 business days please give our office a call.  [facility] [address]  [place] [address] [phone]  Follow-Up: Your next appointment:   Your physician recommends that you schedule a follow-up appointment in: 2-3 MONTHS with Dr. de [place]  Thanks for letting us be apart of your health journey!!  Primary Care and Sports Medicine   Dr. Ceasar Mons [place]   We encourage you to activate your patient portal called "MyChart".  Sign up information is provided on this After Visit Summary.  MyChart is used to connect with patients for Virtual Visits (Telemedicine).  Patients are able to view lab/test results, encounter notes, upcoming appointments, etc.  Non-urgent messages can be sent to your provider as well. To learn more about what you can do with MyChart, please visit --  [email or web address].

## 2020-11-06 NOTE — Progress Notes (Signed)
New Patient Office Visit  Subjective:  Patient ID: Preston Brewer, male    DOB: November 21, 1985  Age: 35 y.o. MRN: [id number]  CC:  Chief Complaint  Patient presents with   Establish Care    No Prior PCP in the last 5 years   Skin Lesion    Patient has a "callus" on his right ankle that appeared a year to a a year and a half ago. He states is causes no pain. He tried to treat at home with apple cidar vinegar and I caused the spot to dry out, bleed, and scab over.    Mass    Paitent has a bump on his left upper thigh that he noticed some time ago. He denies any pain but would like to have it looked at. He has not been evaluated by dermatology    HPI Preston Brewer is a 35 year old male presenting to establish in clinic.  He has current concerns related to 2 separate skin lesions.  Reports past medical history of bipolar disorder.  Skin lesions: Has noticed both over about the past year.  1 is located on left medial thigh, distal portion.  There is a fairly small lesion, no associated pain, bleeding, discharge, itching.  Slight redness around the edges.  Has not noted significant change in size since first noticed.  The other area is located over anterior ankle, slightly laterally.  This area has appeared to look like dry skin, some scaling.  He has tried apple cider vinegar topically which she thinks might of been helping initially, but eventually led to the skin becoming dried out and cracking.  Has not had significant pain or itching associated with this, no drainage.  Bipolar disorder: Currently managed on lamotrigine, follows regularly with psychiatry.  Reports that he has been doing well on current medication which she has been taking for nearly 10 years.  No current issues or concerns related to this today.  Patient has been interested in working towards gradual weight loss.  Has some questions related to this today.  Has been focusing on counting calories, has been walking more, looking to go  to the gym more regularly. Patient works as Production designer, theatre/television/film for [employer].  Past Medical History:  Diagnosis Date   Bipolar 1 disorder (HCC)    GI bleed    H/O endoscopy    Ulcer     History reviewed. No pertinent surgical history.  History reviewed. No pertinent family history.  Social History   Socioeconomic History   Marital status: Married    Spouse name: Not on file   Number of children: Not on file   Years of education: Not on file   Highest education level: Not on file  Occupational History   Not on file  Tobacco Use   Smoking status: Former    Packs/day: 1.00    Years: 15.00    Pack years: 15.00    Types: Cigarettes    Quit date: 04/05/2017    Years since quitting: 3.5   Smokeless tobacco: Never  Vaping Use   Vaping Use: Former  Substance and Sexual Activity   Alcohol use: Yes    Alcohol/week: 1.0 - 2.0 standard drink    Types: 1 - 2 Cans of beer per week    Comment: occ   Drug use: Yes    Types: Marijuana    Comment: hx marijuana, last use 2 months ago   Sexual activity: Yes    Birth control/protection:  Implant    Comment: wife has nexplanon implant  Other Topics Concern   Not on file  Social History Narrative   Not on file   Social Determinants of Health   Financial Resource Strain: Not on file  Food Insecurity: Not on file  Transportation Needs: Not on file  Physical Activity: Not on file  Stress: Not on file  Social Connections: Not on file  Intimate Partner Violence: Not on file    Objective:   Today's Vitals: BP 132/82   Pulse 87   Ht 6\' 1"  (1.854 m)   Wt 236 lb 9.6 oz (107.3 kg)   SpO2 97%   BMI 31.22 kg/m   Physical Exam  35 year old male in no acute distress Cardiovascular exam with regular rate and rhythm, no murmurs appreciated Lungs clear to auscultation bilaterally Skin exam: Left medial thigh with small circular, nodular lesion with slightly pearly tone with erythematous base.  No scaling  present, nontender to palpation. Right ankle with area of scaling, crusting over anterolateral ankle.  No associated erythema, no drainage.  Assessment & Plan:   Problem List Items Addressed This Visit       Musculoskeletal and Integument   Skin lesion - Primary    Area over the left thigh is suggestive of possible pyoderma granuloma.  Will refer to dermatology for further evaluation recommendations Area over right ankle has a somewhat eczematous appearance, although history makes this less likely Will treat initially with topical steroid Will refer to dermatology for further evaluation and recommendations      Relevant Medications   triamcinolone cream (KENALOG) 0.1 %   Other Relevant Orders   Ambulatory referral to Dermatology     Other   Obesity (BMI 30.0-34.9)    Patient with interest and working towards weight loss, has initiated some lifestyle monitor patient's already Encouraged to continue with changes that have been made, gradually increase amount of weekly physical activity Discussed that there is not necessarily 1 specific diet but is superior to others, primary objective is making changes that can be sustained long-term in order to promote gradual, achievable weight loss that can be maintained  discussed option of referral to nutritionist, patient wishes to hold off on this for now       Outpatient Encounter Medications as of 11/06/2020  Medication Sig   lamoTRIgine (LAMICTAL) 200 MG tablet Take 1 tablet (200 mg total) by mouth daily after breakfast.   triamcinolone cream (KENALOG) 0.1 % Apply 1 application topically 2 (two) times daily.   No facility-administered encounter medications on file as of 11/06/2020.    Follow-up: No follow-ups on file.  Plan for follow-up in about 2 to 3 months to complete CPE  Leiana Rund J De 11/08/2020, MD

## 2020-11-06 NOTE — Assessment & Plan Note (Signed)
Area over the left thigh is suggestive of possible pyoderma granuloma.  Will refer to dermatology for further evaluation recommendations Area over right ankle has a somewhat eczematous appearance, although history makes this less likely Will treat initially with topical steroid Will refer to dermatology for further evaluation and recommendations

## 2020-11-06 NOTE — Assessment & Plan Note (Signed)
Patient with interest and working towards weight loss, has initiated some lifestyle monitor patient's already Encouraged to continue with changes that have been made, gradually increase amount of weekly physical activity Discussed that there is not necessarily 1 specific diet but is superior to others, primary objective is making changes that can be sustained long-term in order to promote gradual, achievable weight loss that can be maintained  discussed option of referral to nutritionist, patient wishes to hold off on this for now

## 2021-01-06 ENCOUNTER — Encounter (HOSPITAL_BASED_OUTPATIENT_CLINIC_OR_DEPARTMENT_OTHER): Payer: BC Managed Care – PPO | Admitting: Family Medicine

## 2021-01-16 ENCOUNTER — Telehealth (HOSPITAL_BASED_OUTPATIENT_CLINIC_OR_DEPARTMENT_OTHER): Payer: Self-pay | Admitting: Family Medicine

## 2021-01-16 NOTE — Telephone Encounter (Signed)
Pt called and stated he is needing the coding ot change for his NP appt that was on 11/06/20 to be for preventative care so the insurance will cover it. Please advise.

## 2021-02-03 ENCOUNTER — Ambulatory Visit (INDEPENDENT_AMBULATORY_CARE_PROVIDER_SITE_OTHER): Payer: BC Managed Care – PPO | Admitting: Family Medicine

## 2021-02-03 ENCOUNTER — Encounter (HOSPITAL_BASED_OUTPATIENT_CLINIC_OR_DEPARTMENT_OTHER): Payer: Self-pay | Admitting: Family Medicine

## 2021-02-03 ENCOUNTER — Other Ambulatory Visit: Payer: Self-pay

## 2021-02-03 VITALS — BP 138/94 | HR 76 | Ht 73.5 in | Wt 236.4 lb

## 2021-02-03 DIAGNOSIS — Z23 Encounter for immunization: Secondary | ICD-10-CM

## 2021-02-03 DIAGNOSIS — Z0000 Encounter for general adult medical examination without abnormal findings: Secondary | ICD-10-CM

## 2021-02-03 NOTE — Progress Notes (Signed)
Subjective:    CC: Annual Physical Exam  HPI:  Preston Brewer is a 35 y.o. presenting for annual physical  I reviewed the past medical history, family history, social history, surgical history, and allergies today and no changes were needed.  Please see the problem list section below in epic for further details.  Past Medical History: Past Medical History:  Diagnosis Date   Bipolar 1 disorder (HCC)    GI bleed    H/O endoscopy    Ulcer    Past Surgical History: History reviewed. No pertinent surgical history. Social History: Social History   Socioeconomic History   Marital status: Married    Spouse name: Not on file   Number of children: Not on file   Years of education: Not on file   Highest education level: Not on file  Occupational History   Not on file  Tobacco Use   Smoking status: Former    Packs/day: 1.00    Years: 15.00    Pack years: 15.00    Types: Cigarettes    Quit date: 04/05/2017    Years since quitting: 3.8   Smokeless tobacco: Never  Vaping Use   Vaping Use: Former  Substance and Sexual Activity   Alcohol use: Yes    Alcohol/week: 1.0 - 2.0 standard drink    Types: 1 - 2 Cans of beer per week    Comment: occ   Drug use: Yes    Types: Marijuana    Comment: hx marijuana, last use 2 months ago   Sexual activity: Yes    Birth control/protection: Implant    Comment: wife has nexplanon implant  Other Topics Concern   Not on file  Social History Narrative   Not on file   Social Determinants of Health   Financial Resource Strain: Not on file  Food Insecurity: Not on file  Transportation Needs: Not on file  Physical Activity: Not on file  Stress: Not on file  Social Connections: Not on file   Family History: History reviewed. No pertinent family history. Allergies: No Known Allergies Medications: See med rec.  Review of Systems: No headache, visual changes, nausea, vomiting, diarrhea, constipation, dizziness, abdominal pain, skin rash,  fevers, chills, night sweats, swollen lymph nodes, weight loss, chest pain, body aches, joint swelling, muscle aches, shortness of breath, mood changes, visual or auditory hallucinations.  Objective:    General: Well Developed, well nourished, and in no acute distress.  Neuro: Alert and oriented x3, extra-ocular muscles intact, sensation grossly intact. Cranial nerves II through XII are intact, motor, sensory, and coordinative functions are all intact. HEENT: Normocephalic, atraumatic, pupils equal round reactive to light, neck supple, no masses, no lymphadenopathy, thyroid nonpalpable. Oropharynx, nasopharynx, external ear canals are unremarkable. Skin: Warm and dry, no rashes noted.  Cardiac: Regular rate and rhythm, no murmurs rubs or gallops.  Respiratory: Clear to auscultation bilaterally. Not using accessory muscles, speaking in full sentences.  Abdominal: Soft, nontender, nondistended, positive bowel sounds, no masses, no organomegaly.  Musculoskeletal: Shoulder, elbow, wrist, hip, knee, ankle stable, and with full range of motion.  Impression and Recommendations:    Wellness examination The patient was counseled, risk factors were discussed, anticipatory guidance given. Received flu shot recently at [facility] Not up-to-date on tetanus, agreed to proceed today, tetanus administered Discussed general health promotion, risk reduction strategies, recommend at least 150 minutes/week of moderate intensity physical activity, working towards healthy, gradual weight loss We will check labs today including CBC, CMP, lipid panel and  A1c Plan for follow-up in about 1 year for CPE or sooner as needed   ___________________________________________ Othman Masur de [place], MD, ABFM, CAQSM Primary Care and Sports Medicine [facility]

## 2021-02-03 NOTE — Assessment & Plan Note (Signed)
The patient was counseled, risk factors were discussed, anticipatory guidance given. Received flu shot recently at [facility] Not up-to-date on tetanus, agreed to proceed today, tetanus administered Discussed general health promotion, risk reduction strategies, recommend at least 150 minutes/week of moderate intensity physical activity, working towards healthy, gradual weight loss We will check labs today including CBC, CMP, lipid panel and A1c Plan for follow-up in about 1 year for CPE or sooner as needed

## 2021-02-03 NOTE — Addendum Note (Signed)
Addended by: Rebbeca Paul C on: 02/03/2021 02:05 PM   Modules accepted: Orders

## 2021-02-03 NOTE — Telephone Encounter (Signed)
Pt came in for visit on 11/14 and asked on the update about this re-coding because he has gotten another message about paying. Please advise.

## 2021-02-03 NOTE — Patient Instructions (Signed)
  Medication Instructions:  Your physician recommends that you continue on your current medications as directed. Please refer to the Current Medication list given to you today. --If you need a refill on any your medications before your next appointment, please call your pharmacy first. If no refills are authorized on file call the office.-- Lab Work: Your physician has recommended that you have lab work today: CBC, CMP, Lipid, A1C If you have labs (blood work) drawn today and your tests are completely normal, you will receive your results via MyChart message OR a phone call from our staff.  Please ensure you check your voicemail in the event that you authorized detailed messages to be left on a delegated number. If you have any lab test that is abnormal or we need to change your treatment, we will call you to review the results.  Follow-Up: Your next appointment:   Your physician recommends that you schedule a follow-up appointment in: 1 YEAR with Dr. de Peru  You will receive a text message or e-mail with a link to a survey about your care and experience with [place] today! We would greatly appreciate your feedback!   Thanks for letting us be apart of your health journey!!  Primary Care and Sports Medicine   Dr. Ceasar Mons Peru   We encourage you to activate your patient portal called "MyChart".  Sign up information is provided on this After Visit Summary.  MyChart is used to connect with patients for Virtual Visits (Telemedicine).  Patients are able to view lab/test results, encounter notes, upcoming appointments, etc.  Non-urgent messages can be sent to your provider as well. To learn more about what you can do with MyChart, please visit --  [email or web address].

## 2021-02-04 LAB — LIPID PANEL
Chol/HDL Ratio: 5.3 ratio — ABNORMAL HIGH (ref 0.0–5.0)
Cholesterol, Total: 216 mg/dL — ABNORMAL HIGH (ref 100–199)
HDL: 41 mg/dL (ref >39)
LDL Chol Calc (NIH): 111 mg/dL — ABNORMAL HIGH (ref 0–99)
Triglycerides: 373 mg/dL — ABNORMAL HIGH (ref 0–149)
VLDL Cholesterol Cal: 64 mg/dL — ABNORMAL HIGH (ref 5–40)

## 2021-02-04 LAB — COMPREHENSIVE METABOLIC PANEL
ALT: 23 IU/L (ref 0–44)
AST: 17 IU/L (ref 0–40)
Albumin/Globulin Ratio: 2.4 — ABNORMAL HIGH (ref 1.2–2.2)
Albumin: 4.8 g/dL (ref 4.0–5.0)
Alkaline Phosphatase: 72 IU/L (ref 44–121)
BUN/Creatinine Ratio: 14 (ref 9–20)
BUN: 11 mg/dL (ref 6–20)
Bilirubin Total: 0.3 mg/dL (ref 0.0–1.2)
CO2: 23 mmol/L (ref 20–29)
Calcium: 9.9 mg/dL (ref 8.7–10.2)
Chloride: 102 mmol/L (ref 96–106)
Creatinine, Ser: 0.79 mg/dL (ref 0.76–1.27)
Globulin, Total: 2 g/dL (ref 1.5–4.5)
Glucose: 96 mg/dL (ref 70–99)
Potassium: 4.4 mmol/L (ref 3.5–5.2)
Sodium: 141 mmol/L (ref 134–144)
Total Protein: 6.8 g/dL (ref 6.0–8.5)
eGFR: 120 mL/min/{1.73_m2} (ref >59)

## 2021-02-04 LAB — CBC WITH DIFFERENTIAL/PLATELET
Basophils Absolute: 0 10*3/uL (ref 0.0–0.2)
Basos: 1 %
EOS (ABSOLUTE): 0.2 10*3/uL (ref 0.0–0.4)
Eos: 3 %
Hematocrit: 41.9 % (ref 37.5–51.0)
Hemoglobin: 13.9 g/dL (ref 13.0–17.7)
Immature Grans (Abs): 0 10*3/uL (ref 0.0–0.1)
Immature Granulocytes: 0 %
Lymphocytes Absolute: 1.4 10*3/uL (ref 0.7–3.1)
Lymphs: 31 %
MCH: 30 pg (ref 26.6–33.0)
MCHC: 33.2 g/dL (ref 31.5–35.7)
MCV: 90 fL (ref 79–97)
Monocytes Absolute: 0.4 10*3/uL (ref 0.1–0.9)
Monocytes: 8 %
Neutrophils Absolute: 2.5 10*3/uL (ref 1.4–7.0)
Neutrophils: 57 %
Platelets: 244 10*3/uL (ref 150–450)
RBC: 4.64 x10E6/uL (ref 4.14–5.80)
RDW: 13.1 % (ref 11.6–15.4)
WBC: 4.5 10*3/uL (ref 3.4–10.8)

## 2021-02-04 LAB — HEMOGLOBIN A1C
Est. average glucose Bld gHb Est-mCnc: 108 mg/dL
Hgb A1c MFr Bld: 5.4 % (ref 4.8–5.6)

## 2021-02-11 NOTE — Telephone Encounter (Signed)
Patient had preventative visit completed on 02/03/21 and a new patient appt with addressed concerns on 11/06/20. Will call insurance to verify benefits before resubmitting claim

## 2021-02-12 NOTE — Telephone Encounter (Signed)
LVM regarding this matter for patient let him know if he has question she can call us back.

## 2021-10-03 ENCOUNTER — Encounter: Payer: Self-pay | Admitting: Behavioral Health

## 2021-10-03 ENCOUNTER — Telehealth (INDEPENDENT_AMBULATORY_CARE_PROVIDER_SITE_OTHER): Payer: BC Managed Care – PPO | Admitting: Behavioral Health

## 2021-10-03 DIAGNOSIS — F3173 Bipolar disorder, in partial remission, most recent episode manic: Secondary | ICD-10-CM

## 2021-10-03 DIAGNOSIS — F902 Attention-deficit hyperactivity disorder, combined type: Secondary | ICD-10-CM | POA: Diagnosis not present

## 2021-10-03 MED ORDER — LAMOTRIGINE 200 MG PO TABS: 200.0000 mg | ORAL_TABLET | Freq: Every day | ORAL | 3 refills | Status: DC

## 2021-10-03 NOTE — Progress Notes (Signed)
Preston Brewer [id number] 07/01/1985 36 y.o.  Virtual Visit via Video Note  I connected with pt @ on 10/03/21 at  9:00 AM EDT by a video enabled telemedicine application and verified that I am speaking with the correct person using two identifiers.   I discussed the limitations of evaluation and management by telemedicine and the availability of in person appointments. The patient expressed understanding and agreed to proceed.  I discussed the assessment and treatment plan with the patient. The patient was provided an opportunity to ask questions and all were answered. The patient agreed with the plan and demonstrated an understanding of the instructions.   The patient was advised to call back or seek an in-person evaluation if the symptoms worsen or if the condition fails to improve as anticipated.  I provided 20 minutes of non-face-to-face time during this encounter.  The patient was located at home.  The provider was located at [facility].   Joan Flores, NP   Subjective:   Patient ID:  Preston Brewer is a 36 y.o. (DOB 01/06/1986) male.  Chief Complaint: No chief complaint on file.   HPI 36 year old male presents to this office for follow up and medication management. It has been one year since last visit. He says, "I'm doing great and all is well". Says that he continues to remain stable with no reoccurring  mania in 10 years. Has not had any episodes of bipolar depression. He says that Lamictal was wonderful for him after experiencing so many medication changes when he was younger. He reports depression today at 0/10 and anxiety 0/10. Reports sleeping 7-8 hours per night. No current mania or psychosis. No SI/HI. Patient is in remission but will be continuing Lamictal as maintenance medication.    Review of Systems:  Review of Systems  Constitutional: Negative.   Musculoskeletal:  Negative for gait problem.  Allergic/Immunologic: Negative.   Neurological:  Negative for  tremors.    Medications: I have reviewed the patient's current medications.  Current Outpatient Medications  Medication Sig Dispense Refill   lamoTRIgine (LAMICTAL) 200 MG tablet Take 1 tablet (200 mg total) by mouth daily after breakfast. 90 tablet 3   triamcinolone cream (KENALOG) 0.1 % Apply 1 application topically 2 (two) times daily. 45 g 0   No current facility-administered medications for this visit.    Medication Side Effects: None  Allergies: No Known Allergies  Past Medical History:  Diagnosis Date   Bipolar 1 disorder (HCC)    GI bleed    H/O endoscopy    Ulcer     No family history on file.  Social History   Socioeconomic History   Marital status: Married    Spouse name: Not on file   Number of children: Not on file   Years of education: Not on file   Highest education level: Not on file  Occupational History   Not on file  Tobacco Use   Smoking status: Former    Packs/day: 1.00    Years: 15.00    Total pack years: 15.00    Types: Cigarettes    Quit date: 04/05/2017    Years since quitting: 4.4   Smokeless tobacco: Never  Vaping Use   Vaping Use: Former  Substance and Sexual Activity   Alcohol use: Yes    Alcohol/week: 1.0 - 2.0 standard drink of alcohol    Types: 1 - 2 Cans of beer per week    Comment: occ   Drug use: Yes  Types: Marijuana    Comment: hx marijuana, last use 2 months ago   Sexual activity: Yes    Birth control/protection: Implant    Comment: wife has nexplanon implant  Other Topics Concern   Not on file  Social History Narrative   Not on file   Social Determinants of Health   Financial Resource Strain: Not on file  Food Insecurity: Not on file  Transportation Needs: Not on file  Physical Activity: Not on file  Stress: Not on file  Social Connections: Not on file  Intimate Partner Violence: Not on file    Past Medical History, Surgical history, Social history, and Family history were reviewed and updated as  appropriate.   Please see review of systems for further details on the patient's review from today.   Objective:   Physical Exam:  There were no vitals taken for this visit.  Physical Exam Constitutional:      General: He is not in acute distress.    Appearance: Normal appearance.  Neurological:     Mental Status: He is alert and oriented to person, place, and time.     Gait: Gait normal.  Psychiatric:        Attention and Perception: Attention and perception normal. He does not perceive auditory or visual hallucinations.        Mood and Affect: Mood and affect normal. Mood is not anxious or depressed. Affect is not labile.        Speech: Speech normal.        Behavior: Behavior normal. Behavior is cooperative.        Thought Content: Thought content normal.        Cognition and Memory: Cognition and memory normal.        Judgment: Judgment normal.     Lab Review:     Component Value Date/Time   NA 141 02/03/2021 1023   K 4.4 02/03/2021 1023   CL 102 02/03/2021 1023   CO2 23 02/03/2021 1023   GLUCOSE 96 02/03/2021 1023   GLUCOSE 120 (H) 09/08/2016 2238   BUN 11 02/03/2021 1023   CREATININE 0.79 02/03/2021 1023   CALCIUM 9.9 02/03/2021 1023   PROT 6.8 02/03/2021 1023   ALBUMIN 4.8 02/03/2021 1023   AST 17 02/03/2021 1023   ALT 23 02/03/2021 1023   ALKPHOS 72 02/03/2021 1023   BILITOT 0.3 02/03/2021 1023   GFRNONAA >90 01/24/2014 0643   GFRAA >90 01/24/2014 0643       Component Value Date/Time   WBC 4.5 02/03/2021 1023   WBC 5.2 01/24/2014 0643   RBC 4.64 02/03/2021 1023   RBC 4.81 01/24/2014 0643   HGB 13.9 02/03/2021 1023   HCT 41.9 02/03/2021 1023   PLT 244 02/03/2021 1023   MCV 90 02/03/2021 1023   MCH 30.0 02/03/2021 1023   MCH 31.6 01/24/2014 0643   MCHC 33.2 02/03/2021 1023   MCHC 34.5 01/24/2014 0643   RDW 13.1 02/03/2021 1023   LYMPHSABS 1.4 02/03/2021 1023   MONOABS 1.1 (H) 12/30/2013 1410   EOSABS 0.2 02/03/2021 1023   BASOSABS 0.0  02/03/2021 1023    No results found for: "POCLITH", "LITHIUM"   No results found for: "PHENYTOIN", "PHENOBARB", "VALPROATE", "CBMZ"   .res Assessment: Plan:     RECOMMENDATIONS:   Continue on Lamictal 200 mg daily Will call if experiencing worsening symptoms Pt is stable and has not experienced mania in 9 years. Request annual med checks. Refills e scribed to patients pharmacy Greater  than 50% of 20 min. face to face time with patient was spent on counseling and coordination of care. We discussed his continuing remission with bipolar disorder. He continues with stability and no concerns at this time. He is requesting continued yearly follow up and will call if experiencing negative changes or decline.  Monitor for any sign of rash. Please taking Lamictal and contact office immediately rash develops. Recommend seeking urgent medical attention if rash is severe and/or spreading quickly.       Joan Flores, NP           Diagnoses and all orders for this visit:  Attention deficit hyperactivity disorder (ADHD), combined type, mild     Please see After Visit Summary for patient specific instructions.  Future Appointments  Date Time Provider Department Center  02/04/2022  8:30 AM de [place], Raymond J, MD [facility]  10/02/2022  9:00 AM Joan Flores, NP [facility] None    No orders of the defined types were placed in this encounter.     -------------------------------

## 2021-10-06 ENCOUNTER — Other Ambulatory Visit: Payer: Self-pay

## 2021-10-06 ENCOUNTER — Telehealth: Payer: Self-pay | Admitting: Behavioral Health

## 2021-10-06 DIAGNOSIS — F3173 Bipolar disorder, in partial remission, most recent episode manic: Secondary | ICD-10-CM

## 2021-10-06 MED ORDER — LAMOTRIGINE 200 MG PO TABS: 200.0000 mg | ORAL_TABLET | Freq: Every day | ORAL | 0 refills | Status: DC

## 2021-10-06 NOTE — Telephone Encounter (Signed)
Rx sent 

## 2021-10-06 NOTE — Telephone Encounter (Signed)
Pt lvm at 12:33 pm stating that his lamictal 200 mg was sent ti the wrong pharmacy. [facility] cancel [facility] and send to the [facility] located at [address]

## 2022-01-01 DIAGNOSIS — J020 Streptococcal pharyngitis: Secondary | ICD-10-CM | POA: Diagnosis not present

## 2022-01-27 ENCOUNTER — Other Ambulatory Visit: Payer: Self-pay | Admitting: Behavioral Health

## 2022-01-27 DIAGNOSIS — F3173 Bipolar disorder, in partial remission, most recent episode manic: Secondary | ICD-10-CM

## 2022-02-04 ENCOUNTER — Encounter (HOSPITAL_BASED_OUTPATIENT_CLINIC_OR_DEPARTMENT_OTHER): Payer: BC Managed Care – PPO | Admitting: Family Medicine

## 2022-03-21 DIAGNOSIS — J0100 Acute maxillary sinusitis, unspecified: Secondary | ICD-10-CM | POA: Diagnosis not present

## 2022-10-02 ENCOUNTER — Telehealth (INDEPENDENT_AMBULATORY_CARE_PROVIDER_SITE_OTHER): Payer: BC Managed Care – PPO | Admitting: Behavioral Health

## 2022-10-02 ENCOUNTER — Encounter: Payer: Self-pay | Admitting: Behavioral Health

## 2022-10-02 DIAGNOSIS — F3173 Bipolar disorder, in partial remission, most recent episode manic: Secondary | ICD-10-CM

## 2022-10-02 MED ORDER — LAMOTRIGINE 200 MG PO TABS: 200.0000 mg | ORAL_TABLET | Freq: Every day | ORAL | 4 refills | Status: DC

## 2022-10-02 NOTE — Progress Notes (Signed)
Preston Brewer [id number] [birth year] 37 y.o.  Virtual Visit via Video Note  I connected with pt @ on 10/02/22 at  9:00 AM EDT by a video enabled telemedicine application and verified that I am speaking with the correct person using two identifiers.   I discussed the limitations of evaluation and management by telemedicine and the availability of in person appointments. The patient expressed understanding and agreed to proceed.  I discussed the assessment and treatment plan with the patient. The patient was provided an opportunity to ask questions and all were answered. The patient agreed with the plan and demonstrated an understanding of the instructions.   The patient was advised to call back or seek an in-person evaluation if the symptoms worsen or if the condition fails to improve as anticipated.  I provided 30 minutes of non-face-to-face time during this encounter.  The patient was located at home.  The provider was located at [facility].   Joan Flores, NP   Subjective:   Patient ID:  Preston Brewer is a 37 y.o. (DOB 1985-11-30) male.  Chief Complaint:  Chief Complaint  Patient presents with   Follow-up   Medication Refill   Patient Education    HPI  37 year old male presents via video visit to this office for follow up and medication management. It has been one year since last visit. He says, "I'm doing great and all is well". Says that he continues to remain stable with no reoccurring  mania in 10 years. Has not had any episodes of bipolar depression. He says that Lamictal was wonderful for him after experiencing so many medication changes when he was younger. He reports depression today at 0/10 and anxiety 0/10. Reports sleeping 7-8 hours per night. No current mania or psychosis. No SI/HI. Patient is in remission but will be continuing Lamictal as maintenance medication.         Review of Systems:  Review of Systems  Constitutional: Negative.    Allergic/Immunologic: Negative.   Neurological: Negative.     Medications: I have reviewed the patient's current medications.  Current Outpatient Medications  Medication Sig Dispense Refill   lamoTRIgine (LAMICTAL) 200 MG tablet Take 1 tablet (200 mg total) by mouth daily. 90 tablet 4   triamcinolone cream (KENALOG) 0.1 % Apply 1 application topically 2 (two) times daily. 45 g 0   No current facility-administered medications for this visit.    Medication Side Effects: None  Allergies: No Known Allergies  Past Medical History:  Diagnosis Date   Bipolar 1 disorder (HCC)    GI bleed    H/O endoscopy    Ulcer     No family history on file.  Social History   Socioeconomic History   Marital status: Married    Spouse name: Not on file   Number of children: Not on file   Years of education: Not on file   Highest education level: Not on file  Occupational History   Not on file  Tobacco Use   Smoking status: Former    Current packs/day: 0.00    Average packs/day: 1 pack/day for 15.0 years (15.0 ttl pk-yrs)    Types: Cigarettes    Start date: 04/05/2002    Quit date: 04/05/2017    Years since quitting: 5.4   Smokeless tobacco: Never  Vaping Use   Vaping status: Former  Substance and Sexual Activity   Alcohol use: Yes    Alcohol/week: 1.0 - 2.0 standard drink of alcohol  Types: 1 - 2 Cans of beer per week    Comment: occ   Drug use: Yes    Types: Marijuana    Comment: hx marijuana, last use 2 months ago   Sexual activity: Yes    Birth control/protection: Implant    Comment: wife has nexplanon implant  Other Topics Concern   Not on file  Social History Narrative   Not on file   Social Determinants of Health   Financial Resource Strain: Not on file  Food Insecurity: Not on file  Transportation Needs: Not on file  Physical Activity: Not on file  Stress: Not on file  Social Connections: Not on file  Intimate Partner Violence: Not on file    Past Medical  History, Surgical history, Social history, and Family history were reviewed and updated as appropriate.   Please see review of systems for further details on the patient's review from today.   Objective:   Physical Exam:  There were no vitals taken for this visit.  Physical Exam Constitutional:      General: He is not in acute distress.    Appearance: Normal appearance.  Neurological:     Mental Status: He is alert and oriented to person, place, and time.     Gait: Gait normal.  Psychiatric:        Attention and Perception: Attention and perception normal. He does not perceive auditory or visual hallucinations.        Mood and Affect: Mood and affect normal. Mood is not anxious or depressed. Affect is not labile.        Speech: Speech normal.        Behavior: Behavior normal. Behavior is cooperative.        Thought Content: Thought content normal.        Cognition and Memory: Cognition and memory normal.        Judgment: Judgment normal.     Lab Review:     Component Value Date/Time   NA 141 02/03/2021 1023   K 4.4 02/03/2021 1023   CL 102 02/03/2021 1023   CO2 23 02/03/2021 1023   GLUCOSE 96 02/03/2021 1023   GLUCOSE 120 (H) 09/08/2016 2238   BUN 11 02/03/2021 1023   CREATININE 0.79 02/03/2021 1023   CALCIUM 9.9 02/03/2021 1023   PROT 6.8 02/03/2021 1023   ALBUMIN 4.8 02/03/2021 1023   AST 17 02/03/2021 1023   ALT 23 02/03/2021 1023   ALKPHOS 72 02/03/2021 1023   BILITOT 0.3 02/03/2021 1023   GFRNONAA >90 01/24/2014 0643   GFRAA >90 01/24/2014 0643       Component Value Date/Time   WBC 4.5 02/03/2021 1023   WBC 5.2 01/24/2014 0643   RBC 4.64 02/03/2021 1023   RBC 4.81 01/24/2014 0643   HGB 13.9 02/03/2021 1023   HCT 41.9 02/03/2021 1023   PLT 244 02/03/2021 1023   MCV 90 02/03/2021 1023   MCH 30.0 02/03/2021 1023   MCH 31.6 01/24/2014 0643   MCHC 33.2 02/03/2021 1023   MCHC 34.5 01/24/2014 0643   RDW 13.1 02/03/2021 1023   LYMPHSABS 1.4 02/03/2021 1023    MONOABS 1.1 (H) 12/30/2013 1410   EOSABS 0.2 02/03/2021 1023   BASOSABS 0.0 02/03/2021 1023    No results found for: "POCLITH", "LITHIUM"   No results found for: "PHENYTOIN", "PHENOBARB", "VALPROATE", "CBMZ"   .res Assessment: Plan:    Continue on Lamictal 200 mg daily Will call if experiencing worsening symptoms Pt is stable and  has not experienced mania in 9 years. Request annual med checks. Refills e scribed to patients pharmacy Greater than 50% of 20 min. Video visit  time with patient was spent on counseling and coordination of care. We discussed his continuing remission with bipolar disorder. He continues with stability and no concerns at this time. He is requesting continued yearly follow up and will call if experiencing negative changes or decline.  Monitor for any sign of rash. Please taking Lamictal and contact office immediately rash develops. Recommend seeking urgent medical attention if rash is severe and/or spreading quickly.          Preston Brewer was seen today for follow-up, medication refill and patient education.  Diagnoses and all orders for this visit:  Bipolar I disorder, current or most recent episode manic, in partial remission (HCC) -     lamoTRIgine (LAMICTAL) 200 MG tablet; Take 1 tablet (200 mg total) by mouth daily.     Please see After Visit Summary for patient specific instructions.  Future Appointments  Date Time Provider Department Center  10/01/2023  9:30 AM Avelina Laine A, NP [facility] None    No orders of the defined types were placed in this encounter.     -------------------------------

## 2022-12-09 DIAGNOSIS — J020 Streptococcal pharyngitis: Secondary | ICD-10-CM | POA: Diagnosis not present

## 2022-12-23 DIAGNOSIS — J020 Streptococcal pharyngitis: Secondary | ICD-10-CM | POA: Diagnosis not present

## 2022-12-23 DIAGNOSIS — I10 Essential (primary) hypertension: Secondary | ICD-10-CM | POA: Diagnosis not present

## 2022-12-23 DIAGNOSIS — J029 Acute pharyngitis, unspecified: Secondary | ICD-10-CM | POA: Diagnosis not present

## 2023-02-15 DIAGNOSIS — J020 Streptococcal pharyngitis: Secondary | ICD-10-CM | POA: Diagnosis not present

## 2023-02-15 DIAGNOSIS — J029 Acute pharyngitis, unspecified: Secondary | ICD-10-CM | POA: Diagnosis not present

## 2023-10-01 ENCOUNTER — Encounter: Payer: Self-pay | Admitting: Behavioral Health

## 2023-10-01 ENCOUNTER — Ambulatory Visit (INDEPENDENT_AMBULATORY_CARE_PROVIDER_SITE_OTHER): Payer: BC Managed Care – PPO | Admitting: Behavioral Health

## 2023-10-01 DIAGNOSIS — F3173 Bipolar disorder, in partial remission, most recent episode manic: Secondary | ICD-10-CM | POA: Diagnosis not present

## 2023-10-01 MED ORDER — LAMOTRIGINE 200 MG PO TABS: 200.0000 mg | ORAL_TABLET | Freq: Every day | ORAL | 4 refills | Status: AC

## 2023-10-01 NOTE — Progress Notes (Signed)
 [facility]  Patient ID: Preston Brewer,  MRN: [id number]  PCP: de [place], Raymond J, MD  Date of Evaluation: 10/01/2023 Time spent:30 minutes  Chief Complaint:  Chief Complaint   Advice Only; Follow-up; Medication Refill; Patient Education     HISTORY/CURRENT STATUS: HPI 38 year old male presents via video visit to this office for follow up and medication management. It has been one year since last visit. He says, I'm doing great and all is well. Says that he continues to remain stable with no reoccurring  mania in 11 years. Has not had any episodes of bipolar depression. He has been having some stress related to questioning faith he wanted to discuss today, but overall functioning at high level. He reports depression today at 0/10 and anxiety 0/10. Reports sleeping 7-8 hours per night. No current mania or psychosis. No SI/HI. Patient is in remission but will be continuing Lamictal  as maintenance medication.    Individual Medical History/ Review of Systems: Changes? :No   Allergies: Patient has no known allergies.  Current Medications:  Current Outpatient Medications:    lamoTRIgine  (LAMICTAL ) 200 MG tablet, Take 1 tablet (200 mg total) by mouth daily., Disp: 90 tablet, Rfl: 4   triamcinolone  cream (KENALOG ) 0.1 %, Apply 1 application topically 2 (two) times daily., Disp: 45 g, Rfl: 0 Medication Side Effects: none  Family Medical/ Social History: Changes? No  MENTAL HEALTH EXAM:  There were no vitals taken for this visit.There is no height or weight on file to calculate BMI.  General Appearance: Casual, Neat, and Well Groomed  Eye Contact:  Good  Speech:  Clear and Coherent  Volume:  Normal  Mood:  NA  Affect:  Appropriate  Thought Process:  Coherent  Orientation:  Full (Time, Place, and Person)  Thought Content: Logical   Suicidal Thoughts:  No  Homicidal Thoughts:  No  Memory:  WNL  Judgement:  Good  Insight:  Good  Psychomotor Activity:  Normal   Concentration:  Concentration: Good  Recall:  Good  Fund of Knowledge: Good  Language: Good  Assets:  Desire for Improvement  ADL's:  Intact  Cognition: WNL  Prognosis:  Good    DIAGNOSES:    ICD-10-CM   1. Bipolar I disorder, current or most recent episode manic, in partial remission (HCC)  F31.73 lamoTRIgine  (LAMICTAL ) 200 MG tablet      Receiving Psychotherapy: No    RECOMMENDATIONS:   Greater than 50% of 20 min face to face time with patient was spent on counseling and coordination of care. Discussed his continued great stability. Did have discussion about his state of spirituality and questioning his Christian faith. He continues to function at high level. Stable family life.   Continue on Lamictal  200 mg daily Will call if experiencing worsening symptoms Pt is stable and has not experienced mania in 11 years. Request annual med checks. Refills e scribed to patients pharmacy  Monitor for any sign of rash. Please taking Lamictal  and contact office immediately rash develops. Recommend seeking urgent medical attention if rash is severe and/or spreading quickly.      Redell DELENA Pizza, NP

## 2024-02-18 ENCOUNTER — Encounter

## 2024-10-02 ENCOUNTER — Ambulatory Visit: Admitting: Behavioral Health
